# Patient Record
Sex: Female | Born: 1945 | Race: White | Hispanic: No | Smoking: Never smoker
Health system: Southern US, Community
[De-identification: ages and names within clinical notes are randomized; demographics above are authoritative.]

## PROBLEM LIST (undated history)

## (undated) DIAGNOSIS — I420 Dilated cardiomyopathy: Secondary | ICD-10-CM

## (undated) DIAGNOSIS — I219 Acute myocardial infarction, unspecified: Secondary | ICD-10-CM

## (undated) DIAGNOSIS — K219 Gastro-esophageal reflux disease without esophagitis: Secondary | ICD-10-CM

## (undated) DIAGNOSIS — E538 Deficiency of other specified B group vitamins: Secondary | ICD-10-CM

## (undated) DIAGNOSIS — E785 Hyperlipidemia, unspecified: Secondary | ICD-10-CM

## (undated) DIAGNOSIS — I2510 Atherosclerotic heart disease of native coronary artery without angina pectoris: Secondary | ICD-10-CM

## (undated) DIAGNOSIS — R0600 Dyspnea, unspecified: Secondary | ICD-10-CM

## (undated) DIAGNOSIS — E039 Hypothyroidism, unspecified: Secondary | ICD-10-CM

## (undated) DIAGNOSIS — M1990 Unspecified osteoarthritis, unspecified site: Secondary | ICD-10-CM

## (undated) HISTORY — PX: CARDIAC CATHETERIZATION: SHX172

## (undated) HISTORY — PX: ABDOMINAL HYSTERECTOMY: SHX81

---

## 2003-09-16 DIAGNOSIS — I219 Acute myocardial infarction, unspecified: Secondary | ICD-10-CM

## 2003-09-16 DIAGNOSIS — I214 Non-ST elevation (NSTEMI) myocardial infarction: Secondary | ICD-10-CM

## 2003-09-16 HISTORY — PX: CARDIAC CATHETERIZATION: SHX172

## 2003-09-16 HISTORY — DX: Non-ST elevation (NSTEMI) myocardial infarction: I21.4

## 2003-09-16 HISTORY — DX: Acute myocardial infarction, unspecified: I21.9

## 2004-07-10 ENCOUNTER — Ambulatory Visit: Payer: Self-pay | Admitting: Internal Medicine

## 2005-06-05 ENCOUNTER — Ambulatory Visit: Payer: Self-pay | Admitting: Internal Medicine

## 2005-07-17 ENCOUNTER — Ambulatory Visit: Payer: Self-pay | Admitting: Internal Medicine

## 2007-03-18 ENCOUNTER — Ambulatory Visit: Payer: Self-pay | Admitting: Internal Medicine

## 2008-09-06 ENCOUNTER — Ambulatory Visit: Payer: Self-pay | Admitting: Internal Medicine

## 2009-09-11 ENCOUNTER — Ambulatory Visit: Payer: Self-pay | Admitting: Internal Medicine

## 2010-09-27 ENCOUNTER — Ambulatory Visit: Payer: Self-pay | Admitting: Internal Medicine

## 2011-11-06 ENCOUNTER — Ambulatory Visit: Payer: Self-pay | Admitting: Internal Medicine

## 2012-10-18 ENCOUNTER — Emergency Department: Payer: Self-pay | Admitting: Unknown Physician Specialty

## 2012-10-18 LAB — CK TOTAL AND CKMB (NOT AT ARMC)
CK, Total: 62 U/L (ref 21–215)
CK-MB: 0.5 ng/mL (ref 0.5–3.6)

## 2012-10-18 LAB — CBC
HGB: 13.8 g/dL (ref 12.0–16.0)
MCH: 28.9 pg (ref 26.0–34.0)
MCHC: 31.5 g/dL — ABNORMAL LOW (ref 32.0–36.0)
MCV: 92 fL (ref 80–100)
Platelet: 267 10*3/uL (ref 150–440)
WBC: 8.9 10*3/uL (ref 3.6–11.0)

## 2012-10-18 LAB — BASIC METABOLIC PANEL
Anion Gap: 10 (ref 7–16)
Chloride: 106 mmol/L (ref 98–107)
Co2: 22 mmol/L (ref 21–32)
EGFR (African American): 60 — ABNORMAL LOW
Osmolality: 279 (ref 275–301)
Sodium: 138 mmol/L (ref 136–145)

## 2013-04-12 ENCOUNTER — Ambulatory Visit: Payer: Self-pay | Admitting: Internal Medicine

## 2013-04-21 ENCOUNTER — Ambulatory Visit: Payer: Self-pay | Admitting: Unknown Physician Specialty

## 2013-12-05 DIAGNOSIS — I2510 Atherosclerotic heart disease of native coronary artery without angina pectoris: Secondary | ICD-10-CM | POA: Insufficient documentation

## 2014-02-20 ENCOUNTER — Emergency Department (HOSPITAL_COMMUNITY)
Admission: EM | Admit: 2014-02-20 | Discharge: 2014-02-20 | Disposition: A | Discharge status: Home / Self Care | Payer: Medicare Other | Attending: Emergency Medicine | Admitting: Emergency Medicine

## 2014-02-20 ENCOUNTER — Emergency Department (HOSPITAL_COMMUNITY): Payer: Medicare Other

## 2014-02-20 ENCOUNTER — Encounter (HOSPITAL_COMMUNITY): Payer: Self-pay | Admitting: Emergency Medicine

## 2014-02-20 DIAGNOSIS — Z8679 Personal history of other diseases of the circulatory system: Secondary | ICD-10-CM | POA: Insufficient documentation

## 2014-02-20 DIAGNOSIS — I252 Old myocardial infarction: Secondary | ICD-10-CM | POA: Insufficient documentation

## 2014-02-20 DIAGNOSIS — IMO0002 Reserved for concepts with insufficient information to code with codable children: Secondary | ICD-10-CM | POA: Insufficient documentation

## 2014-02-20 DIAGNOSIS — M5417 Radiculopathy, lumbosacral region: Secondary | ICD-10-CM

## 2014-02-20 DIAGNOSIS — Z79899 Other long term (current) drug therapy: Secondary | ICD-10-CM | POA: Insufficient documentation

## 2014-02-20 HISTORY — DX: Acute myocardial infarction, unspecified: I21.9

## 2014-02-20 MED ORDER — PREDNISONE 20 MG PO TABS: ORAL_TABLET | ORAL | Status: DC

## 2014-02-20 MED ORDER — PREDNISONE 20 MG PO TABS
60.0000 mg | ORAL_TABLET | Freq: Once | ORAL | Status: AC
  Administered 2014-02-20: 60 mg via ORAL
  Filled 2014-02-20: qty 3

## 2014-02-20 MED ORDER — OXYCODONE-ACETAMINOPHEN 5-325 MG PO TABS: 1.0000 | ORAL_TABLET | ORAL | Status: DC | PRN

## 2014-02-20 NOTE — ED Notes (Addendum)
Pt c/o left hip pain x 1 month, went to PCP prescribed tramadol 50 mg. Pt states the pain has not subsided even with medication. Pt has not had picture of hip done.

## 2014-02-20 NOTE — ED Provider Notes (Signed)
CSN: [id number]     Arrival date & time 02/20/14  1245 History   First MD Initiated Contact with Patient 02/20/14 1307     Chief Complaint  Patient presents with  . Hip Pain     (Consider location/radiation/quality/duration/timing/severity/associated sxs/prior Treatment) HPI  Cindy Harper is a 68 y.o. female complaining of severe, atraumatic left lumbar sacral pain worsening over 4 weeks. Pain radiates down the posterior of the leg past the knee. Patient was seen by her primary care provided with tramadol and meloxicam with little relief. The pain is better after she gets up and walks for a while. Patient denies numbness, weakness, difficulty ambulating, prior history of trauma to the affected joint. Abdominal pain, nausea vomiting, change in bowel or bladder habits, fever, saddle anesthesia, history of cancer or IV drug use.  Past Medical History  Diagnosis Date  . Heart attack 2005   Past Surgical History  Procedure Laterality Date  . Abdominal hysterectomy     No family history on file. History  Substance Use Topics  . Smoking status: Never Smoker   . Smokeless tobacco: Not on file  . Alcohol Use: No   OB History   Grav Para Term Preterm Abortions TAB SAB Ect Mult Living                 Review of Systems  10 systems reviewed and found to be negative, except as noted in the HPI.    Allergies  Review of patient's allergies indicates no known allergies.  Home Medications   Prior to Admission medications   Medication Sig Start Date End Date Taking? Authorizing Provider  acetaminophen (TYLENOL) 500 MG tablet Take 500 mg by mouth every 6 (six) hours as needed for mild pain or headache.   Yes Historical Provider, MD  levothyroxine (SYNTHROID, LEVOTHROID) 100 MCG tablet Take 100 mcg by mouth daily before breakfast.   Yes Historical Provider, MD  meloxicam (MOBIC) 15 MG tablet Take 15 mg by mouth daily.   Yes Historical Provider, MD  oxyCODONE-acetaminophen  (PERCOCET/ROXICET) 5-325 MG per tablet Take 1 tablet by mouth every 4 (four) hours as needed for severe pain. May take 2 tablets PO q 6 hours for severe pain - Do not take with Tylenol as this tablet already contains tylenol 02/20/14   Joni Reining Kamylah Manzo, PA-C  predniSONE (DELTASONE) 20 MG tablet 3 tabs po daily x 3 days, then 2 tabs x 3 days, then 1.5 tabs x 3 days, then 1 tab x 3 days, then 0.5 tabs x 3 days 02/20/14   Joni Reining Korban Shearer, PA-C  traMADol (ULTRAM) 50 MG tablet Take 50 mg by mouth 3 (three) times daily.   Yes Historical Provider, MD   BP 131/75  Pulse 59  Temp(Src) 98.2 F (36.8 C) (Oral)  Resp 18  SpO2 99% Physical Exam  Nursing note and vitals reviewed. Constitutional: She is oriented to person, place, and time. She appears well-developed and well-nourished. No distress.  HENT:  Head: Normocephalic and atraumatic.  Mouth/Throat: Oropharynx is clear and moist.  Eyes: Conjunctivae and EOM are normal. Pupils are equal, round, and reactive to light.  Neck: Normal range of motion.  Cardiovascular: Normal rate, regular rhythm and intact distal pulses.   Pulmonary/Chest: Effort normal and breath sounds normal. No stridor. No respiratory distress. She has no wheezes. She has no rales. She exhibits no tenderness.  Abdominal: Soft. Bowel sounds are normal.  Musculoskeletal: Normal range of motion.       Back:  No point tenderness to percussion of lumbar spinal processes.  No TTP or paraspinal muscular spasm. Strength is 5 out of 5 to bilateral lower extremities at hip and knee; extensor hallucis longus 5 out of 5. Ankle strength 5 out of 5, no clonus, neurovascularly intact. No saddle anaesthesia. Patellar reflexes are 2+ bilaterally.    Straight leg raise is positive on the ipsilateral (left) side at 20, negative on the contralateral side.   Neurological: She is alert and oriented to person, place, and time.  Psychiatric: She has a normal mood and affect.    ED Course  Procedures  (including critical care time) Labs Review Labs Reviewed - No data to display  Imaging Review Dg Hip Complete Left  02/20/2014   CLINICAL DATA:  Increasing left hip pain.  EXAM: LEFT HIP - COMPLETE 2+ VIEW  COMPARISON:  None.  FINDINGS: Hip joint space is maintained bilaterally. No subchondral sclerosis or cyst formation. No osteophytosis. Obturator rings are intact. Degenerative changes are seen at the lumbosacral junction.  IMPRESSION: 1. No findings in the hips to explain the patient's pain. 2. Degenerative changes at the lumbosacral junction.   Electronically Signed   By: Leanna BattlesMelinda  Blietz M.D.   On: 02/20/2014 13:37     EKG Interpretation None      MDM   Final diagnoses:  Lumbosacral radiculopathy   Filed Vitals:   02/20/14 1301 02/20/14 1417  BP: 134/70 131/75  Pulse: 60 59  Temp: 98.2 F (36.8 C)   TempSrc: Oral   Resp: 16 18  SpO2: 98% 99%    Medications  predniSONE (DELTASONE) tablet 60 mg (60 mg Oral Given 02/20/14 1416)    Cindy Harper is a 68 y.o. female presenting with left lumbosacral radiculopathy. 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 Physical exam consistent with a sciatic radiculopathy.  back pain.  No neurological deficits and normal neuro exam.  Patient can walk but states is painful.  No loss of bowel or bladder control.  No concern for cauda equina.  Advised close followup with primary care and orthopedics.  Patient lives alone and I'm concerned about falls. We have discussed taking the oxycodone only at night before bedtime. In the day she will continue to take the tramadol. Will also start her on prednisone.  Evaluation does not show pathology that would require ongoing emergent intervention or inpatient treatment. Pt is hemodynamically stable and mentating appropriately. Discussed findings and plan with patient/guardian, who agrees with care plan. All questions answered. Return precautions discussed and outpatient follow up given.    Discharge Medication List as of 02/20/2014  1:52 PM    START taking these medications   Details  oxyCODONE-acetaminophen (PERCOCET/ROXICET) 5-325 MG per tablet Take 1 tablet by mouth every 4 (four) hours as needed for severe pain. May take 2 tablets PO q 6 hours for severe pain - Do not take with Tylenol as this tablet already contains tylenol, Starting 02/20/2014, Until Discontinued, Print    predniSONE (DELTASONE) 20 MG tablet 3 tabs po daily x 3 days, then 2 tabs x 3 days, then 1.5 tabs x 3 days, then 1 tab x 3 days, then 0.5 tabs x 3 days, Print           Wynetta Emeryicole Jayten Gabbard, PA-C 02/20/14 1640

## 2014-02-20 NOTE — Discharge Instructions (Signed)
Take acetaminophen (Tylenol) up to 975 mg (this is normally 3 over-the-counter pills) up to 3 times a day. Do not drink alcohol. Make sure your other medications do not contain acetaminophen (Read the labels!)  Take oxycodone for breakthrough pain before bedtime, do not drink alcohol, drive, care for children or do other critical tasks while taking oxycodone.  Do not combine oxycodone and tramadol.  Please be very careful not to fall!  The pain medication puts you at risk for falls. Please rest as much as possible and try to not stay alone.   Please follow with your primary care doctor in the next 2 days for a check-up. They must obtain records for further management.   Do not hesitate to return to the Emergency Department for any new, worsening or concerning symptoms.       Lumbosacral Radiculopathy Lumbosacral radiculopathy is a pinched nerve or nerves in the low back (lumbosacral area). When this happens you may have weakness in your legs and may not be able to stand on your toes. You may have pain going down into your legs. There may be difficulties with walking normally. There are many causes of this problem. Sometimes this may happen from an injury, or simply from arthritis or boney problems. It may also be caused by other illnesses such as diabetes. If there is no improvement after treatment, further studies may be done to find the exact cause. DIAGNOSIS  X-rays may be needed if the problems become long standing. Electromyograms may be done. This study is one in which the working of nerves and muscles is studied. HOME CARE INSTRUCTIONS   Applications of ice packs may be helpful. Ice can be used in a plastic bag with a towel around it to prevent frostbite to skin. This may be used every 2 hours for 20 to 30 minutes, or as needed, while awake, or as directed by your caregiver.  Only take over-the-counter or prescription medicines for pain, discomfort, or fever as directed by your  caregiver.  If physical therapy was prescribed, follow your caregiver's directions. SEEK IMMEDIATE MEDICAL CARE IF:   You have pain not controlled with medications.  You seem to be getting worse rather than better.  You develop increasing weakness in your legs.  You develop loss of bowel or bladder control.  You have difficulty with walking or balance, or develop clumsiness in the use of your legs.  You have a fever. MAKE SURE YOU:   Understand these instructions.  Will watch your condition.  Will get help right away if you are not doing well or get worse. Document Released: 09/01/2005 Document Revised: 11/24/2011 Document Reviewed: 04/21/2008 [facility] Patient Information 2014 [place], [place].

## 2014-02-20 NOTE — ED Notes (Signed)
Pt in xray

## 2014-02-21 MED ORDER — OXYCODONE HCL 5 MG PO TABS: 5.0000 mg | ORAL_TABLET | ORAL | Status: DC | PRN

## 2014-02-21 NOTE — ED Provider Notes (Signed)
Medical screening examination/treatment/procedure(s) were performed by non-physician practitioner and as supervising physician I was immediately available for consultation/collaboration.  Flint Melter, MD 02/21/14 [phone]

## 2014-02-21 NOTE — ED Provider Notes (Signed)
6:46 PM Called the patient to advise her not to take Tylenol with the Percocet. Patient verbalized her understanding. Reports that she is feeling better. States she will follow with orthopedics.   Wynetta Emery, PA-C 02/21/14 1846  Flint Melter, MD 02/24/14 2322

## 2014-02-24 ENCOUNTER — Ambulatory Visit: Payer: Self-pay | Admitting: Internal Medicine

## 2014-05-16 ENCOUNTER — Ambulatory Visit: Payer: Self-pay | Admitting: Internal Medicine

## 2014-06-12 ENCOUNTER — Ambulatory Visit: Payer: Self-pay | Admitting: General Practice

## 2014-07-11 DIAGNOSIS — E782 Mixed hyperlipidemia: Secondary | ICD-10-CM | POA: Insufficient documentation

## 2015-01-05 NOTE — Consult Note (Signed)
PATIENT NAME:  Cindy Harper, Cindy P MR#:  [id number] DATE OF BIRTH:  May 21, 1946  DATE OF CONSULTATION:  10/18/2012  REFERRING PHYSICIAN:     Dr. Enedina FinnerGoli. CONSULTING PHYSICIAN:  Lamar BlinksBruce J. Krayton Wortley, MD  REASON FOR CONSULTATION: Chest discomfort, known coronary artery disease.   CHIEF COMPLAINT: "I had chest pain."   HISTORY OF PRESENT ILLNESS:  This is a 69 year old female with known coronary artery disease with minimal cardiac atherosclerosis by cardiac catheterization many years prior. The patient has had no evidence of significant hyperlipidemia requiring medication management. Hypertension has been controlled and she does not have diabetes. She had new onset of chest pain, substernal in nature, with some shortness of breath and weakness over a 30 to 40 minute period.  She was seen in the Emergency Room with full resolution spontaneously. The patient had an EKG showing normal sinus rhythm with nonspecific ST changes and troponin and CK-MB are within normal limits. She is fine now with ambulation and no other significant symptoms.   REVIEW OF SYSTEMS: Remainder of review of systems negative for vision change, ringing in the ears, hearing loss, cough, congestion, heartburn, nausea, vomiting, diarrhea, bloody stool, stomach pain, extremity pain, leg weakness, cramping of the buttocks, known blood clots, headaches, blackouts, dizzy spells, nosebleed, congestion, trouble swallowing, frequent urination, urination at night, muscle which is numbness, anxiety, depression, skin lesions or skin rashes.   PAST MEDICAL HISTORY:  Coronary artery disease.   FAMILY HISTORY: No family members with early onset of cardiovascular disease or hypertension.   SOCIAL HISTORY: The patient currently denies alcohol or tobacco use.   ALLERGIES: No known drug allergies.   CURRENT MEDICATIONS: As listed.   PHYSICAL EXAMINATION: VITAL SIGNS: Blood pressure 126/68 bilaterally, heart rate 72 upright, reclining and regular.   GENERAL: She is a well appearing female in no acute distress.  HEENT: No icterus, thyromegaly, ulcers, hemorrhage or xanthelasma.  HEART: Regular rate and rhythm with normal S1 and S2 without murmur, gallop or rub. PMI is normal size and placement. Carotid upstroke normal without bruit. Jugular venous pressure is normal.  LUNGS: Lungs are clear to auscultation with normal respirations.  ABDOMEN: Soft, nontender, without hepatosplenomegaly or masses. Abdominal aorta is normal size without bruit.  EXTREMITIES: 2+ bilateral pulses in dorsal, pedal, radial and femoral arteries without lower extremity edema, cyanosis, clubbing or ulcers.  NEUROLOGIC: She is oriented to time, place and person with normal mood and affect.   ASSESSMENT: A 69 year old female with chest pain of unknown etiology, shortness of breath now resolved with no evidence of myocardial infarction or acute myocardial infarction, stable at this time.   RECOMMENDATIONS: 1.  Aspirin 325 mg each day.  2.  Ambulate patient and follow for improvements of symptoms.  3.  If the patient doing fairly well with no other significant symptoms, may discharge to home with follow-up stress test thereafter for further evaluation of hyperlipidemia with high intensity cholesterol therapy due to coronary artery disease.  4.  Recheck as above.     ____________________________ Lamar BlinksBruce J. Kadeen Sroka, MD bjk:ct D: 10/18/2012 17:39:58 ET T: 10/19/2012 08:57:07 ET JOB#: [id number]  cc: Lamar BlinksBruce J. Lamonda Noxon, MD, <Dictator> Lamar BlinksBRUCE J Garrie Elenes MD ELECTRONICALLY SIGNED 11/12/2012 8:52

## 2015-01-16 ENCOUNTER — Other Ambulatory Visit: Payer: Self-pay | Admitting: Internal Medicine

## 2015-01-16 DIAGNOSIS — Z1231 Encounter for screening mammogram for malignant neoplasm of breast: Secondary | ICD-10-CM

## 2015-05-22 ENCOUNTER — Other Ambulatory Visit: Payer: Self-pay | Admitting: Internal Medicine

## 2015-05-22 ENCOUNTER — Ambulatory Visit
Admission: RE | Admit: 2015-05-22 | Discharge: 2015-05-22 | Disposition: A | Discharge status: Home / Self Care | Payer: Medicare Other | Source: Ambulatory Visit | Attending: Internal Medicine | Admitting: Internal Medicine

## 2015-05-22 DIAGNOSIS — Z1231 Encounter for screening mammogram for malignant neoplasm of breast: Secondary | ICD-10-CM

## 2016-04-09 ENCOUNTER — Other Ambulatory Visit: Payer: Self-pay | Admitting: Internal Medicine

## 2016-04-09 DIAGNOSIS — Z1231 Encounter for screening mammogram for malignant neoplasm of breast: Secondary | ICD-10-CM

## 2016-05-27 ENCOUNTER — Ambulatory Visit: Payer: Medicare Other

## 2016-06-05 ENCOUNTER — Ambulatory Visit: Payer: Medicare Other

## 2016-06-19 ENCOUNTER — Ambulatory Visit
Admission: RE | Admit: 2016-06-19 | Discharge: 2016-06-19 | Disposition: A | Discharge status: Home / Self Care | Payer: Medicare Other | Source: Ambulatory Visit | Attending: Internal Medicine | Admitting: Internal Medicine

## 2016-06-19 ENCOUNTER — Other Ambulatory Visit: Payer: Self-pay | Admitting: Internal Medicine

## 2016-06-19 DIAGNOSIS — Z1231 Encounter for screening mammogram for malignant neoplasm of breast: Secondary | ICD-10-CM | POA: Diagnosis present

## 2016-06-25 ENCOUNTER — Encounter
Admission: RE | Admit: 2016-06-25 | Discharge: 2016-06-25 | Disposition: A | Discharge status: Home / Self Care | Payer: Medicare Other | Source: Ambulatory Visit | Attending: Orthopedic Surgery | Admitting: Orthopedic Surgery

## 2016-06-25 DIAGNOSIS — Z01810 Encounter for preprocedural cardiovascular examination: Secondary | ICD-10-CM | POA: Diagnosis present

## 2016-06-25 DIAGNOSIS — E039 Hypothyroidism, unspecified: Secondary | ICD-10-CM | POA: Diagnosis not present

## 2016-06-25 DIAGNOSIS — I252 Old myocardial infarction: Secondary | ICD-10-CM | POA: Insufficient documentation

## 2016-06-25 DIAGNOSIS — K219 Gastro-esophageal reflux disease without esophagitis: Secondary | ICD-10-CM | POA: Diagnosis not present

## 2016-06-25 DIAGNOSIS — Z01812 Encounter for preprocedural laboratory examination: Secondary | ICD-10-CM | POA: Diagnosis present

## 2016-06-25 HISTORY — DX: Hypothyroidism, unspecified: E03.9

## 2016-06-25 HISTORY — DX: Gastro-esophageal reflux disease without esophagitis: K21.9

## 2016-06-25 HISTORY — DX: Dyspnea, unspecified: R06.00

## 2016-06-25 LAB — URINALYSIS COMPLETE WITH MICROSCOPIC (ARMC ONLY)
Bilirubin Urine: NEGATIVE
Glucose, UA: NEGATIVE mg/dL
KETONES UR: NEGATIVE mg/dL
NITRITE: POSITIVE — AB
PH: 6 (ref 5.0–8.0)
PROTEIN: NEGATIVE mg/dL
SPECIFIC GRAVITY, URINE: 1.017 (ref 1.005–1.030)

## 2016-06-25 LAB — COMPREHENSIVE METABOLIC PANEL
ALBUMIN: 4.1 g/dL (ref 3.5–5.0)
ALK PHOS: 93 U/L (ref 38–126)
ALT: 15 U/L (ref 14–54)
ANION GAP: 6 (ref 5–15)
AST: 15 U/L (ref 15–41)
BUN: 15 mg/dL (ref 6–20)
CALCIUM: 9.1 mg/dL (ref 8.9–10.3)
CO2: 25 mmol/L (ref 22–32)
Chloride: 109 mmol/L (ref 101–111)
Creatinine, Ser: 0.93 mg/dL (ref 0.44–1.00)
GFR calc Af Amer: >60 mL/min (ref >60)
GFR calc non Af Amer: 60 mL/min — ABNORMAL LOW (ref >60)
GLUCOSE: 83 mg/dL (ref 65–99)
Potassium: 4 mmol/L (ref 3.5–5.1)
Sodium: 140 mmol/L (ref 135–145)
TOTAL PROTEIN: 7 g/dL (ref 6.5–8.1)
Total Bilirubin: 0.7 mg/dL (ref 0.3–1.2)

## 2016-06-25 LAB — CBC
HEMATOCRIT: 40 % (ref 35.0–47.0)
HEMOGLOBIN: 13.6 g/dL (ref 12.0–16.0)
MCH: 31.1 pg (ref 26.0–34.0)
MCHC: 33.9 g/dL (ref 32.0–36.0)
MCV: 91.5 fL (ref 80.0–100.0)
Platelets: 246 10*3/uL (ref 150–440)
RBC: 4.37 MIL/uL (ref 3.80–5.20)
RDW: 14.5 % (ref 11.5–14.5)
WBC: 6.4 10*3/uL (ref 3.6–11.0)

## 2016-06-25 LAB — SURGICAL PCR SCREEN
MRSA, PCR: NEGATIVE
STAPHYLOCOCCUS AUREUS: NEGATIVE

## 2016-06-25 LAB — SEDIMENTATION RATE: SED RATE: 9 mm/h (ref 0–30)

## 2016-06-25 LAB — PROTIME-INR
INR: 0.94
Prothrombin Time: 12.6 seconds (ref 11.4–15.2)

## 2016-06-25 LAB — TYPE AND SCREEN
ABO/RH(D): A NEG
Antibody Screen: NEGATIVE

## 2016-06-25 LAB — APTT: APTT: 30 s (ref 24–36)

## 2016-06-25 NOTE — Pre-Procedure Instructions (Signed)
REQUEST FOR MEDICAL CLEARANCE/EKG,AS INSTRUCTED BY DR ADAMS CALLED AND [facility]ED TO [facility]

## 2016-06-25 NOTE — Patient Instructions (Signed)
  Your procedure is scheduled on: 07/09/16 Wed Report to Same Day Surgery 2nd floor medical mall To find out your arrival time please call [phone] between 1PM - 3PM on 07/08/16 Tues  Remember: Instructions that are not followed completely may result in serious medical risk, up to and including death, or upon the discretion of your surgeon and anesthesiologist your surgery may need to be rescheduled.    _x___ 1. Do not eat food or drink liquids after midnight. No gum chewing or hard candies.     __x__ 2. No Alcohol for 24 hours before or after surgery.   __x__3. No Smoking for 24 prior to surgery.   ____  4. Bring all medications with you on the day of surgery if instructed.    __x__ 5. Notify your doctor if there is any change in your medical condition     (cold, fever, infections).     Do not wear jewelry, make-up, hairpins, clips or nail polish.  Do not wear lotions, powders, or perfumes. You may wear deodorant.  Do not shave 48 hours prior to surgery. Men may shave face and neck.  Do not bring valuables to the hospital.    [facility] not [facility] any belongings or valuables.               Contacts, dentures or bridgework may not be worn into surgery.  Leave your suitcase in the car. After surgery it may be brought to your room.  For patients admitted to the hospital, discharge time is determined by your treatment team.   Patients discharged the day of surgery will not be allowed to drive home.    Please read over the following fact sheets that you were given:   [facility] Pre[facility] and or MRSA Information   _x___ Take these medicines the morning of surgery with A SIP OF WATER:    1. levothyroxine (SYNTHROID, LEVOTHROID) 100 MCG tablet  2.omeprazole (PRILOSEC OTC) 20 MG tablet  3.  4.  5.  6.  ____Fleets enema or Magnesium Citrate as directed.   _x___ Use CHG Soap or sage wipes as directed on instruction sheet   ____ Use inhalers on the  day of surgery and bring to hospital day of surgery  ____ Stop metformin 2 days prior to surgery    ____ Take 1/2 of usual insulin dose the night before surgery and none on the morning of           surgery.   __x__ Stop aspirin or coumadin, or plavix Stop Aspirin 1 week before surgery  x__ Stop Anti-inflammatories such as Advil, Aleve, Ibuprofen, Motrin, Naproxen,          Naprosyn, Goodies powders or aspirin products. Ok to take Tylenol. Stop Lodine 1 week before surgery   ____ Stop supplements until after surgery.    ____ Bring C-Pap to the hospital.

## 2016-06-27 LAB — URINE CULTURE
Culture: >=100000 — AB
SPECIAL REQUESTS: NORMAL

## 2016-07-02 NOTE — Pre-Procedure Instructions (Signed)
CLEARED BY DR Judie PetitM  MILLER LOW RISK 07/01/16

## 2016-07-09 ENCOUNTER — Inpatient Hospital Stay: Payer: Medicare Other | Admitting: Certified Registered Nurse Anesthetist

## 2016-07-09 ENCOUNTER — Encounter
Admission: RE | Disposition: A | Discharge status: Home Health | Payer: Self-pay | Source: Ambulatory Visit | Attending: Orthopedic Surgery

## 2016-07-09 ENCOUNTER — Encounter: Payer: Self-pay | Admitting: *Deleted

## 2016-07-09 ENCOUNTER — Inpatient Hospital Stay
Admission: RE | Admit: 2016-07-09 | Discharge: 2016-07-11 | DRG: 470 | Disposition: A | Discharge status: Home Health | Payer: Medicare Other | Source: Ambulatory Visit | Attending: Orthopedic Surgery | Admitting: Orthopedic Surgery

## 2016-07-09 ENCOUNTER — Inpatient Hospital Stay: Payer: Medicare Other

## 2016-07-09 DIAGNOSIS — Z96659 Presence of unspecified artificial knee joint: Secondary | ICD-10-CM

## 2016-07-09 DIAGNOSIS — I2510 Atherosclerotic heart disease of native coronary artery without angina pectoris: Secondary | ICD-10-CM | POA: Diagnosis present

## 2016-07-09 DIAGNOSIS — Z79899 Other long term (current) drug therapy: Secondary | ICD-10-CM

## 2016-07-09 DIAGNOSIS — Z888 Allergy status to other drugs, medicaments and biological substances status: Secondary | ICD-10-CM | POA: Diagnosis not present

## 2016-07-09 DIAGNOSIS — E039 Hypothyroidism, unspecified: Secondary | ICD-10-CM | POA: Diagnosis present

## 2016-07-09 DIAGNOSIS — M6281 Muscle weakness (generalized): Secondary | ICD-10-CM

## 2016-07-09 DIAGNOSIS — K219 Gastro-esophageal reflux disease without esophagitis: Secondary | ICD-10-CM | POA: Diagnosis present

## 2016-07-09 DIAGNOSIS — R262 Difficulty in walking, not elsewhere classified: Secondary | ICD-10-CM

## 2016-07-09 DIAGNOSIS — E785 Hyperlipidemia, unspecified: Secondary | ICD-10-CM | POA: Diagnosis present

## 2016-07-09 DIAGNOSIS — I252 Old myocardial infarction: Secondary | ICD-10-CM | POA: Diagnosis not present

## 2016-07-09 DIAGNOSIS — M25562 Pain in left knee: Secondary | ICD-10-CM

## 2016-07-09 DIAGNOSIS — Z96652 Presence of left artificial knee joint: Secondary | ICD-10-CM

## 2016-07-09 DIAGNOSIS — Z6832 Body mass index (BMI) 32.0-32.9, adult: Secondary | ICD-10-CM | POA: Diagnosis not present

## 2016-07-09 DIAGNOSIS — M1712 Unilateral primary osteoarthritis, left knee: Secondary | ICD-10-CM | POA: Diagnosis present

## 2016-07-09 HISTORY — PX: KNEE ARTHROPLASTY: SHX992

## 2016-07-09 LAB — ABO/RH: ABO/RH(D): A NEG

## 2016-07-09 SURGERY — ARTHROPLASTY, KNEE, TOTAL, USING IMAGELESS COMPUTER-ASSISTED NAVIGATION
Anesthesia: Spinal | Site: Knee | Laterality: Left | Wound class: Clean

## 2016-07-09 MED ORDER — PANTOPRAZOLE SODIUM 40 MG PO TBEC
40.0000 mg | DELAYED_RELEASE_TABLET | Freq: Two times a day (BID) | ORAL | Status: DC
  Administered 2016-07-09 – 2016-07-11 (×5): 40 mg via ORAL
  Filled 2016-07-09 (×5): qty 1

## 2016-07-09 MED ORDER — VITAMIN B-12 100 MCG PO TABS
100.0000 ug | ORAL_TABLET | Freq: Every day | ORAL | Status: DC
  Administered 2016-07-09 – 2016-07-11 (×2): 100 ug via ORAL
  Filled 2016-07-09 (×3): qty 1

## 2016-07-09 MED ORDER — TETRACAINE HCL 1 % IJ SOLN: INTRAMUSCULAR | Status: DC | PRN

## 2016-07-09 MED ORDER — MENTHOL 3 MG MT LOZG
1.0000 | LOZENGE | OROMUCOSAL | Status: DC | PRN
  Filled 2016-07-09: qty 9

## 2016-07-09 MED ORDER — SODIUM CHLORIDE 0.9 % IV SOLN
INTRAVENOUS | Status: DC
  Administered 2016-07-09: 21:00:00 via INTRAVENOUS
  Administered 2016-07-09: 1000 mL via INTRAVENOUS

## 2016-07-09 MED ORDER — LIDOCAINE HCL (CARDIAC) 20 MG/ML IV SOLN
INTRAVENOUS | Status: DC | PRN
  Administered 2016-07-09 (×2): 30 mg via INTRAVENOUS

## 2016-07-09 MED ORDER — ONDANSETRON HCL 4 MG/2ML IJ SOLN: 4.0000 mg | Freq: Once | INTRAMUSCULAR | Status: DC | PRN

## 2016-07-09 MED ORDER — BISACODYL 10 MG RE SUPP: 10.0000 mg | Freq: Every day | RECTAL | Status: DC | PRN

## 2016-07-09 MED ORDER — SODIUM CHLORIDE 0.9 % IJ SOLN
INTRAMUSCULAR | Status: AC
  Filled 2016-07-09: qty 50

## 2016-07-09 MED ORDER — ACETAMINOPHEN 650 MG RE SUPP: 650.0000 mg | Freq: Four times a day (QID) | RECTAL | Status: DC | PRN

## 2016-07-09 MED ORDER — BUPIVACAINE HCL (PF) 0.25 % IJ SOLN
INTRAMUSCULAR | Status: DC | PRN
  Administered 2016-07-09: 60 mL

## 2016-07-09 MED ORDER — CEFAZOLIN SODIUM-DEXTROSE 2-4 GM/100ML-% IV SOLN
2.0000 g | Freq: Four times a day (QID) | INTRAVENOUS | Status: AC
  Administered 2016-07-09 – 2016-07-10 (×4): 2 g via INTRAVENOUS
  Filled 2016-07-09 (×5): qty 100

## 2016-07-09 MED ORDER — NEOMYCIN-POLYMYXIN B GU 40-200000 IR SOLN
Status: DC | PRN
  Administered 2016-07-09: 14 mL

## 2016-07-09 MED ORDER — CHLORHEXIDINE GLUCONATE 4 % EX LIQD
60.0000 mL | Freq: Once | CUTANEOUS | Status: AC
  Administered 2016-07-09: 4 via TOPICAL

## 2016-07-09 MED ORDER — LACTATED RINGERS IV SOLN
INTRAVENOUS | Status: DC
  Administered 2016-07-09: 07:00:00 via INTRAVENOUS

## 2016-07-09 MED ORDER — ONDANSETRON HCL 4 MG/2ML IJ SOLN: 4.0000 mg | Freq: Four times a day (QID) | INTRAMUSCULAR | Status: DC | PRN

## 2016-07-09 MED ORDER — PHENOL 1.4 % MT LIQD
1.0000 | OROMUCOSAL | Status: DC | PRN
  Filled 2016-07-09: qty 177

## 2016-07-09 MED ORDER — PROPOFOL 10 MG/ML IV BOLUS
INTRAVENOUS | Status: DC | PRN
  Administered 2016-07-09 (×2): 10 mg via INTRAVENOUS

## 2016-07-09 MED ORDER — ACETAMINOPHEN 10 MG/ML IV SOLN
1000.0000 mg | Freq: Four times a day (QID) | INTRAVENOUS | Status: AC
  Administered 2016-07-09 (×3): 1000 mg via INTRAVENOUS
  Filled 2016-07-09 (×4): qty 100

## 2016-07-09 MED ORDER — CEFAZOLIN SODIUM-DEXTROSE 2-4 GM/100ML-% IV SOLN
2.0000 g | INTRAVENOUS | Status: AC
  Administered 2016-07-09: 2 g via INTRAVENOUS

## 2016-07-09 MED ORDER — BUPIVACAINE HCL (PF) 0.5 % IJ SOLN
INTRAMUSCULAR | Status: DC | PRN
  Administered 2016-07-09: 2.5 mL

## 2016-07-09 MED ORDER — TRANEXAMIC ACID 1000 MG/10ML IV SOLN
1000.0000 mg | INTRAVENOUS | Status: AC
  Administered 2016-07-09: 1000 mg via INTRAVENOUS
  Filled 2016-07-09: qty 10

## 2016-07-09 MED ORDER — SODIUM CHLORIDE 0.9 % IV SOLN
INTRAVENOUS | Status: DC | PRN
  Administered 2016-07-09: 60 mL

## 2016-07-09 MED ORDER — NEOMYCIN-POLYMYXIN B GU 40-200000 IR SOLN
Status: AC
  Filled 2016-07-09: qty 20

## 2016-07-09 MED ORDER — MORPHINE SULFATE (PF) 2 MG/ML IV SOLN: 2.0000 mg | INTRAVENOUS | Status: DC | PRN

## 2016-07-09 MED ORDER — ONDANSETRON HCL 4 MG PO TABS: 4.0000 mg | ORAL_TABLET | Freq: Four times a day (QID) | ORAL | Status: DC | PRN

## 2016-07-09 MED ORDER — PROPOFOL 500 MG/50ML IV EMUL
INTRAVENOUS | Status: DC | PRN
  Administered 2016-07-09: 65 ug/kg/min via INTRAVENOUS

## 2016-07-09 MED ORDER — TRAMADOL HCL 50 MG PO TABS
50.0000 mg | ORAL_TABLET | ORAL | Status: DC | PRN
  Administered 2016-07-09: 50 mg via ORAL
  Administered 2016-07-09 – 2016-07-10 (×4): 100 mg via ORAL
  Administered 2016-07-10 – 2016-07-11 (×2): 50 mg via ORAL
  Filled 2016-07-09: qty 2
  Filled 2016-07-09: qty 1
  Filled 2016-07-09: qty 2
  Filled 2016-07-09: qty 1
  Filled 2016-07-09 (×2): qty 2
  Filled 2016-07-09: qty 1
  Filled 2016-07-09: qty 2

## 2016-07-09 MED ORDER — BUPIVACAINE LIPOSOME 1.3 % IJ SUSP
INTRAMUSCULAR | Status: AC
  Filled 2016-07-09: qty 20

## 2016-07-09 MED ORDER — ACETAMINOPHEN 325 MG PO TABS
650.0000 mg | ORAL_TABLET | Freq: Four times a day (QID) | ORAL | Status: DC | PRN
  Administered 2016-07-11: 650 mg via ORAL
  Filled 2016-07-09: qty 2

## 2016-07-09 MED ORDER — TETRACAINE HCL 1 % IJ SOLN
INTRAMUSCULAR | Status: DC | PRN
  Administered 2016-07-09: 2.5 mg via INTRASPINAL

## 2016-07-09 MED ORDER — BUPIVACAINE HCL (PF) 0.25 % IJ SOLN
INTRAMUSCULAR | Status: AC
  Filled 2016-07-09: qty 60

## 2016-07-09 MED ORDER — DIPHENHYDRAMINE HCL 12.5 MG/5ML PO ELIX: 12.5000 mg | ORAL_SOLUTION | ORAL | Status: DC | PRN

## 2016-07-09 MED ORDER — MIDAZOLAM HCL 5 MG/5ML IJ SOLN
INTRAMUSCULAR | Status: DC | PRN
  Administered 2016-07-09: 2 mg via INTRAVENOUS

## 2016-07-09 MED ORDER — PHENYLEPHRINE HCL 10 MG/ML IJ SOLN
INTRAMUSCULAR | Status: DC | PRN
  Administered 2016-07-09: 100 ug via INTRAVENOUS

## 2016-07-09 MED ORDER — TETRACAINE HCL 1 % IJ SOLN
INTRAMUSCULAR | Status: AC
  Filled 2016-07-09: qty 2

## 2016-07-09 MED ORDER — SODIUM CHLORIDE 0.9 % IV SOLN
INTRAVENOUS | Status: DC | PRN
  Administered 2016-07-09: 10 ug/min via INTRAVENOUS

## 2016-07-09 MED ORDER — ACETAMINOPHEN 10 MG/ML IV SOLN
INTRAVENOUS | Status: AC
  Filled 2016-07-09: qty 100

## 2016-07-09 MED ORDER — OXYCODONE HCL 5 MG PO TABS: 5.0000 mg | ORAL_TABLET | ORAL | Status: DC | PRN

## 2016-07-09 MED ORDER — ALUM & MAG HYDROXIDE-SIMETH 200-200-20 MG/5ML PO SUSP: 30.0000 mL | ORAL | Status: DC | PRN

## 2016-07-09 MED ORDER — ACETAMINOPHEN 10 MG/ML IV SOLN
INTRAVENOUS | Status: DC | PRN
  Administered 2016-07-09: 1000 mg via INTRAVENOUS

## 2016-07-09 MED ORDER — SODIUM CHLORIDE 0.9 % IV BOLUS (SEPSIS)
500.0000 mL | Freq: Once | INTRAVENOUS | Status: AC
  Administered 2016-07-09: 500 mL via INTRAVENOUS

## 2016-07-09 MED ORDER — FERROUS SULFATE 325 (65 FE) MG PO TABS
325.0000 mg | ORAL_TABLET | Freq: Two times a day (BID) | ORAL | Status: DC
  Administered 2016-07-09 – 2016-07-11 (×4): 325 mg via ORAL
  Filled 2016-07-09 (×4): qty 1

## 2016-07-09 MED ORDER — LEVOTHYROXINE SODIUM 100 MCG PO TABS
100.0000 ug | ORAL_TABLET | Freq: Every day | ORAL | Status: DC
  Administered 2016-07-10 – 2016-07-11 (×3): 100 ug via ORAL
  Filled 2016-07-09 (×3): qty 1

## 2016-07-09 MED ORDER — FENTANYL CITRATE (PF) 100 MCG/2ML IJ SOLN: 25.0000 ug | INTRAMUSCULAR | Status: DC | PRN

## 2016-07-09 MED ORDER — CELECOXIB 200 MG PO CAPS
200.0000 mg | ORAL_CAPSULE | Freq: Two times a day (BID) | ORAL | Status: DC
  Administered 2016-07-09 – 2016-07-11 (×5): 200 mg via ORAL
  Filled 2016-07-09 (×5): qty 1

## 2016-07-09 MED ORDER — ENOXAPARIN SODIUM 30 MG/0.3ML ~~LOC~~ SOLN
30.0000 mg | Freq: Two times a day (BID) | SUBCUTANEOUS | Status: DC
  Administered 2016-07-10 – 2016-07-11 (×3): 30 mg via SUBCUTANEOUS
  Filled 2016-07-09 (×3): qty 0.3

## 2016-07-09 MED ORDER — SODIUM CHLORIDE 0.9 % IV SOLN
1000.0000 mg | Freq: Once | INTRAVENOUS | Status: AC
  Administered 2016-07-09: 1000 mg via INTRAVENOUS
  Filled 2016-07-09: qty 10

## 2016-07-09 MED ORDER — SENNOSIDES-DOCUSATE SODIUM 8.6-50 MG PO TABS
1.0000 | ORAL_TABLET | Freq: Two times a day (BID) | ORAL | Status: DC
  Administered 2016-07-09 – 2016-07-11 (×5): 1 via ORAL
  Filled 2016-07-09 (×5): qty 1

## 2016-07-09 MED ORDER — BUPIVACAINE HCL (PF) 0.5 % IJ SOLN: INTRAMUSCULAR | Status: DC | PRN

## 2016-07-09 MED ORDER — DEXAMETHASONE SODIUM PHOSPHATE 10 MG/ML IJ SOLN
INTRAMUSCULAR | Status: DC | PRN
  Administered 2016-07-09: 10 mg via INTRAVENOUS

## 2016-07-09 MED ORDER — METOCLOPRAMIDE HCL 10 MG PO TABS
10.0000 mg | ORAL_TABLET | Freq: Three times a day (TID) | ORAL | Status: AC
  Administered 2016-07-09 – 2016-07-11 (×8): 10 mg via ORAL
  Filled 2016-07-09 (×9): qty 1

## 2016-07-09 MED ORDER — FENTANYL CITRATE (PF) 100 MCG/2ML IJ SOLN
INTRAMUSCULAR | Status: DC | PRN
  Administered 2016-07-09 (×2): 50 ug via INTRAVENOUS

## 2016-07-09 MED ORDER — CEFAZOLIN SODIUM-DEXTROSE 2-4 GM/100ML-% IV SOLN
INTRAVENOUS | Status: AC
Start: 2016-07-09 — End: 2016-07-09
  Filled 2016-07-09: qty 100

## 2016-07-09 MED ORDER — ONDANSETRON HCL 4 MG/2ML IJ SOLN
INTRAMUSCULAR | Status: DC | PRN
  Administered 2016-07-09: 4 mg via INTRAVENOUS

## 2016-07-09 MED ORDER — MAGNESIUM HYDROXIDE 400 MG/5ML PO SUSP: 30.0000 mL | Freq: Every day | ORAL | Status: DC | PRN

## 2016-07-09 MED ORDER — FLEET ENEMA 7-19 GM/118ML RE ENEM: 1.0000 | ENEMA | Freq: Once | RECTAL | Status: DC | PRN

## 2016-07-09 SURGICAL SUPPLY — 59 items
AUTOTRANSFUS HAS 1/8 (MISCELLANEOUS) ×3
BATTERY INSTRU NAVIGATION (MISCELLANEOUS) ×12 IMPLANT
BLADE SAW 1 (BLADE) ×3 IMPLANT
BLADE SAW 1/2 (BLADE) ×3 IMPLANT
BONE CEMENT GENTAMICIN (Cement) ×3 IMPLANT
CANISTER SUCT 1200ML W/VALVE (MISCELLANEOUS) ×3 IMPLANT
CANISTER SUCT 3000ML (MISCELLANEOUS) ×6 IMPLANT
CAPT KNEE TOTAL 3 ATTUNE ×3 IMPLANT
CATH TRAY METER 16FR LF (MISCELLANEOUS) ×3 IMPLANT
CEMENT BONE GENTAMICIN 40 (Cement) ×1 IMPLANT
COOLER POLAR GLACIER W/PUMP (MISCELLANEOUS) ×3 IMPLANT
CUFF TOURN 24 STER (MISCELLANEOUS) IMPLANT
CUFF TOURN 30 STER DUAL PORT (MISCELLANEOUS) ×3 IMPLANT
DRAPE SHEET LG 3/4 BI-LAMINATE (DRAPES) ×3 IMPLANT
DRSG DERMACEA 8X12 NADH (GAUZE/BANDAGES/DRESSINGS) ×3 IMPLANT
DRSG OPSITE POSTOP 4X14 (GAUZE/BANDAGES/DRESSINGS) ×3 IMPLANT
DRSG TEGADERM 4X4.75 (GAUZE/BANDAGES/DRESSINGS) ×3 IMPLANT
DURAPREP 26ML APPLICATOR (WOUND CARE) ×6 IMPLANT
ELECT CAUTERY BLADE 6.4 (BLADE) ×3 IMPLANT
ELECT REM PT RETURN 9FT ADLT (ELECTROSURGICAL) ×3
ELECTRODE REM PT RTRN 9FT ADLT (ELECTROSURGICAL) ×1 IMPLANT
EX-PIN ORTHOLOCK NAV 4X150 (PIN) ×6 IMPLANT
GLOVE BIOGEL M STRL SZ7.5 (GLOVE) ×6 IMPLANT
GLOVE INDICATOR 8.0 STRL GRN (GLOVE) ×3 IMPLANT
GLOVE SURG 9.0 ORTHO LTXF (GLOVE) ×3 IMPLANT
GLOVE SURG ORTHO 9.0 STRL STRW (GLOVE) ×3 IMPLANT
GOWN STRL REUS W/ TWL LRG LVL3 (GOWN DISPOSABLE) ×2 IMPLANT
GOWN STRL REUS W/TWL 2XL LVL3 (GOWN DISPOSABLE) ×3 IMPLANT
GOWN STRL REUS W/TWL LRG LVL3 (GOWN DISPOSABLE) ×4
HANDPIECE INTERPULSE COAX TIP (DISPOSABLE) ×2
HOLDER FOLEY CATH W/STRAP (MISCELLANEOUS) ×3 IMPLANT
HOOD PEEL AWAY FLYTE STAYCOOL (MISCELLANEOUS) ×6 IMPLANT
KIT RM TURNOVER STRD PROC AR (KITS) ×3 IMPLANT
KNIFE SCULPS 14X20 (INSTRUMENTS) ×3 IMPLANT
LABEL OR SOLS (LABEL) ×3 IMPLANT
NDL SAFETY 18GX1.5 (NEEDLE) ×3 IMPLANT
NEEDLE SPNL 20GX3.5 QUINCKE YW (NEEDLE) ×3 IMPLANT
NS IRRIG 500ML POUR BTL (IV SOLUTION) ×3 IMPLANT
PACK TOTAL KNEE (MISCELLANEOUS) ×3 IMPLANT
PAD WRAPON POLAR KNEE (MISCELLANEOUS) ×1 IMPLANT
PIN FIXATION 1/8DIA X 3INL (PIN) ×3 IMPLANT
SET HNDPC FAN SPRY TIP SCT (DISPOSABLE) ×1 IMPLANT
SOL .9 NS 3000ML IRR  AL (IV SOLUTION) ×2
SOL .9 NS 3000ML IRR UROMATIC (IV SOLUTION) ×1 IMPLANT
SOL PREP PVP 2OZ (MISCELLANEOUS) ×3
SOLUTION PREP PVP 2OZ (MISCELLANEOUS) ×1 IMPLANT
SPONGE DRAIN TRACH 4X4 STRL 2S (GAUZE/BANDAGES/DRESSINGS) ×3 IMPLANT
STAPLER SKIN PROX 35W (STAPLE) ×3 IMPLANT
SUCTION FRAZIER HANDLE 10FR (MISCELLANEOUS) ×2
SUCTION TUBE FRAZIER 10FR DISP (MISCELLANEOUS) ×1 IMPLANT
SUT VIC AB 0 CT1 36 (SUTURE) ×3 IMPLANT
SUT VIC AB 1 CT1 36 (SUTURE) ×6 IMPLANT
SUT VIC AB 2-0 CT2 27 (SUTURE) ×3 IMPLANT
SYR 20CC LL (SYRINGE) ×3 IMPLANT
SYR 30ML LL (SYRINGE) ×6 IMPLANT
SYSTEM AUTOTRANSFUS DUAL TROCR (MISCELLANEOUS) ×1 IMPLANT
TOWEL OR 17X26 4PK STRL BLUE (TOWEL DISPOSABLE) ×3 IMPLANT
TOWER CARTRIDGE SMART MIX (DISPOSABLE) ×3 IMPLANT
WRAPON POLAR PAD KNEE (MISCELLANEOUS) ×3

## 2016-07-09 NOTE — Anesthesia Procedure Notes (Signed)
Date/Time: 07/09/2016 7:35 AM Performed by: Ginger CarneMICHELET, Nessie Nong Pre-anesthesia Checklist: Patient identified, Emergency Drugs available, Suction available, Patient being monitored and Timeout performed Patient Re-evaluated:Patient Re-evaluated prior to inductionOxygen Delivery Method: Simple face mask

## 2016-07-09 NOTE — Anesthesia Preprocedure Evaluation (Signed)
Anesthesia Evaluation  Patient identified by MRN, date of birth, ID band Patient awake    Reviewed: Allergy & Precautions, NPO status , Patient's Chart, lab work & pertinent test results  Airway Mallampati: III  TM Distance: >3 FB     Dental  (+) Upper Dentures, Partial Lower   Pulmonary shortness of breath and with exertion,    Pulmonary exam normal        Cardiovascular + CAD and + Past MI  Normal cardiovascular exam     Neuro/Psych negative neurological ROS  negative psych ROS   GI/Hepatic Neg liver ROS, GERD  Medicated and Controlled,  Endo/Other  Hypothyroidism   Renal/GU   negative genitourinary   Musculoskeletal negative musculoskeletal ROS (+)   Abdominal Normal abdominal exam  (+)   Peds negative pediatric ROS (+)  Hematology negative hematology ROS (+)   Anesthesia Other Findings   Reproductive/Obstetrics                             Anesthesia Physical Anesthesia Plan  ASA: III  Anesthesia Plan: Spinal   Post-op Pain Management:    Induction: Intravenous  Airway Management Planned: Nasal Cannula  Additional Equipment:   Intra-op Plan:   Post-operative Plan:   Informed Consent: I have reviewed the patients History and Physical, chart, labs and discussed the procedure including the risks, benefits and alternatives for the proposed anesthesia with the patient or authorized representative who has indicated his/her understanding and acceptance.   Dental advisory given  Plan Discussed with: CRNA and Surgeon  Anesthesia Plan Comments:         Anesthesia Quick Evaluation

## 2016-07-09 NOTE — H&P (Signed)
The patient has been re-examined, and the chart reviewed, and there have been no interval changes to the documented history and physical.    The risks, benefits, and alternatives have been discussed at length. The patient expressed understanding of the risks benefits and agreed with plans for surgical intervention.  James P. Hooten, Jr. M.D.    

## 2016-07-09 NOTE — Brief Op Note (Signed)
07/09/2016  11:04 AM  PATIENT:  Cindy Harper  70 y.o. female  PRE-OPERATIVE DIAGNOSIS:  OSTEOARTHRITIS of the left knee  POST-OPERATIVE DIAGNOSIS:  Same  PROCEDURE:  Procedure(s): COMPUTER ASSISTED TOTAL KNEE ARTHROPLASTY (Left)  SURGEON:  Surgeon(s) and Role:    * Donato HeinzJames P Hooten, MD - Primary  ASSISTANTS: Van ClinesJon Wolfe, PA   ANESTHESIA:   spinal  EBL:  Total I/O In: 1000 [I.V.:1000] Out: 650 [Urine:625; Blood:25]  BLOOD ADMINISTERED:none  DRAINS: 2 medium drains to a reinfusion system   LOCAL MEDICATIONS USED:  MARCAINE    and OTHER Exparel  SPECIMEN:  No Specimen  DISPOSITION OF SPECIMEN:  N/A  COUNTS:  YES  TOURNIQUET:   91 minutes  DICTATION: .Dragon Dictation  PLAN OF CARE: Admit to inpatient   PATIENT DISPOSITION:  PACU - hemodynamically stable.   Delay start of Pharmacological VTE agent (>24hrs) due to surgical blood loss or risk of bleeding: yes

## 2016-07-09 NOTE — Progress Notes (Signed)
Patient BP 97/48. MD notified. Ordered 500cc bolus.  Harvie HeckMelanie Maudean Hoffmann, RN

## 2016-07-09 NOTE — Progress Notes (Signed)
PT Cancellation Note  Patient Details Name: Cindy Harper MRN: [id number] DOB: 1946-01-01   Cancelled Treatment:    Reason Eval/Treat Not Completed: Other (comment). Consult received and chart reviewed. Eval attempted, however pt still with spinal block limiting OOB mobility. Numbness noted in B feet. Not safe for ambulation at this time. RN notified and agreeable to dangling EOB later this evening. Will re-attempt evaluation next date.   Abimelec Grochowski 07/09/2016, 3:54 PM Elizabeth PalauStephanie Christohper Dube, PT, DPT [id number]

## 2016-07-09 NOTE — Op Note (Signed)
OPERATIVE NOTE  DATE OF SURGERY:  07/09/2016  PATIENT NAME:  Cindy Harper   DOB: 22-Aug-1946  MRN: [id number]  PRE-OPERATIVE DIAGNOSIS: Degenerative arthrosis of the left knee, primary  POST-OPERATIVE DIAGNOSIS:  Same  PROCEDURE:  Left total knee arthroplasty using computer-assisted navigation  SURGEON:  Jena GaussJames P Hooten, Jr. M.D.  ASSISTANT:  Van ClinesJon Wolfe, PA (present and scrubbed throughout the case, critical for assistance with exposure, retraction, instrumentation, and closure)  ANESTHESIA: spinal  ESTIMATED BLOOD LOSS: 25 mL  FLUIDS REPLACED: 1000 mL of crystalloid  TOURNIQUET TIME: 91 minutes  DRAINS: 2 medium drains to a reinfusion system  SOFT TISSUE RELEASES: Anterior cruciate ligament, posterior cruciate ligament, deep medial collateral ligament, patellofemoral ligament  IMPLANTS UTILIZED: DePuy Attune size 7 posterior stabilized femoral component (cemented), size 6 rotating platform tibial component (cemented), 38 mm medialized dome patella (cemented), and a 6 mm stabilized rotating platform polyethylene insert.  INDICATIONS FOR SURGERY: Cindy Harper is a 70 y.o. year old female with a long history of progressive knee pain. X-rays demonstrated severe degenerative changes in tricompartmental fashion. The patient had not seen any significant improvement despite conservative nonsurgical intervention. After discussion of the risks and benefits of surgical intervention, the patient expressed understanding of the risks benefits and agree with plans for total knee arthroplasty.   The risks, benefits, and alternatives were discussed at length including but not limited to the risks of infection, bleeding, nerve injury, stiffness, blood clots, the need for revision surgery, cardiopulmonary complications, among others, and they were willing to proceed.  PROCEDURE IN DETAIL: The patient was brought into the operating room and, after adequate spinal anesthesia was achieved, a  tourniquet was placed on the patient's upper thigh. The patient's knee and leg were cleaned and prepped with alcohol and DuraPrep and draped in the usual sterile fashion. A "timeout" was performed as per usual protocol. The lower extremity was exsanguinated using an Esmarch, and the tourniquet was inflated to 300 mmHg. An anterior longitudinal incision was made followed by a standard mid vastus approach. The deep fibers of the medial collateral ligament were elevated in a subperiosteal fashion off of the medial flare of the tibia so as to maintain a continuous soft tissue sleeve. The patella was subluxed laterally and the patellofemoral ligament was incised. Inspection of the knee demonstrated severe degenerative changes with full-thickness loss of articular cartilage. Osteophytes were debrided using a rongeur. Anterior and posterior cruciate ligaments were excised. Two 4.0 mm Schanz pins were inserted in the femur and into the tibia for attachment of the array of trackers used for computer-assisted navigation. Hip center was identified using a circumduction technique. Distal landmarks were mapped using the computer. The distal femur and proximal tibia were mapped using the computer. The distal femoral cutting guide was positioned using computer-assisted navigation so as to achieve a 5 distal valgus cut. The femur was sized and it was felt that a size 7 femoral component was appropriate. A size 7 femoral cutting guide was positioned and the anterior cut was performed and verified using the computer. This was followed by completion of the posterior and chamfer cuts. Femoral cutting guide for the central box was then positioned in the center box cut was performed.  Attention was then directed to the proximal tibia. Medial and lateral menisci were excised. The extramedullary tibial cutting guide was positioned using computer-assisted navigation so as to achieve a 0 varus-valgus alignment and 3 posterior slope. The  cut was performed and verified using the computer.  The proximal tibia was sized and it was felt that a size 6 tibial tray was appropriate. Tibial and femoral trials were inserted followed by insertion of a 6 mm polyethylene insert. This allowed for excellent mediolateral soft tissue balancing both in flexion and in full extension. Finally, the patella was cut and prepared so as to accommodate a 38 mm medialized dome patella. A patella trial was placed and the knee was placed through a range of motion with excellent patellar tracking appreciated. The femoral trial was removed after debridement of posterior osteophytes. The central post-hole for the tibial component was reamed followed by insertion of a keel punch. Tibial trials were then removed. Cut surfaces of bone were irrigated with copious amounts of normal saline with antibiotic solution using pulsatile lavage and then suctioned dry. Polymethylmethacrylate cement with gentamicin was prepared in the usual fashion using a vacuum mixer. Cement was applied to the cut surface of the proximal tibia as well as along the undersurface of a size 6 rotating platform tibial component. Tibial component was positioned and impacted into place. Excess cement was removed using Personal assistant. Cement was then applied to the cut surfaces of the femur as well as along the posterior flanges of the size 7 femoral component. The femoral component was positioned and impacted into place. Excess cement was removed using Personal assistant. A 6 mm polyethylene trial was inserted and the knee was brought into full extension with steady axial compression applied. Finally, cement was applied to the backside of a 38 mm medialized dome patella and the patellar component was positioned and patellar clamp applied. Excess cement was removed using Personal assistant. After adequate curing of the cement, the tourniquet was deflated after a total tourniquet time of 91 minutes. Hemostasis was achieved  using electrocautery. The knee was irrigated with copious amounts of normal saline with antibiotic solution using pulsatile lavage and then suctioned dry. 20 mL of 1.3% Exparel and 60 mL of 0.25% Marcaine in 40 mL of normal saline was injected along the posterior capsule, medial and lateral gutters, and along the arthrotomy site. A 6 mm stabilized rotating platform polyethylene insert was inserted and the knee was placed through a range of motion with excellent mediolateral soft tissue balancing appreciated and excellent patellar tracking noted. 2 medium drains were placed in the wound bed and brought out through separate stab incisions to be attached to a reinfusion system. The medial parapatellar portion of the incision was reapproximated using interrupted sutures of #1 Vicryl. Subcutaneous tissue was approximated in layers using first #0 Vicryl followed #2-0 Vicryl. The skin was approximated with skin staples. A sterile dressing was applied.  The patient tolerated the procedure well and was transported to the recovery room in stable condition.    James P. Angie Fava., M.D.

## 2016-07-09 NOTE — Transfer of Care (Signed)
Immediate Anesthesia Transfer of Care Note  Patient: Cindy Harper  Procedure(s) Performed: Procedure(s): COMPUTER ASSISTED TOTAL KNEE ARTHROPLASTY (Left)  Patient Location: PACU  Anesthesia Type:Spinal  Level of Consciousness: awake, alert  and oriented  Airway & Oxygen Therapy: Patient Spontanous Breathing and Patient connected to face mask oxygen  Post-op Assessment: Report given to RN and Post -op Vital signs reviewed and stable  Post vital signs: Reviewed and stable  Last Vitals:  Vitals:   07/09/16 0610  BP: 128/65  Pulse: 74  Resp: 18  Temp: 36.2 C    Last Pain:  Vitals:   07/09/16 0610  TempSrc: Oral  PainSc: 5          Complications: No apparent anesthesia complications

## 2016-07-09 NOTE — Anesthesia Procedure Notes (Signed)
Spinal  Patient location during procedure: OR Start time: 07/09/2016 7:25 AM End time: 07/09/2016 7:37 AM Staffing Anesthesiologist: Alvin Critchley Resident/CRNA: Johnna Acosta Performed: anesthesiologist and resident/CRNA  Preanesthetic Checklist Completed: patient identified, site marked, surgical consent, pre-op evaluation, timeout performed, IV checked, risks and benefits discussed and monitors and equipment checked Spinal Block Patient position: sitting Prep: Betadine Patient monitoring: heart rate, continuous pulse ox, blood pressure and cardiac monitor Approach: left paramedian Location: L3-4 Injection technique: single-shot Needle Needle type: Introducer, Quincke and Whitacre  Needle gauge: 24 G Needle length: 9 cm Assessment Sensory level: T10 Additional Notes Negative paresthesia. Negative blood return. Positive free-flowing CSF. Expiration date of kit checked and confirmed. Patient tolerated procedure well, without complications.

## 2016-07-09 NOTE — NC FL2 (Signed)
[place] MEDICAID FL2 LEVEL OF CARE SCREENING TOOL     IDENTIFICATION  Patient Name: Cindy Harper Birthdate: 01-May-1946 Sex: female Admission Date (Current Location): 07/09/2016  [place] and [place] Number:  Chiropodist and Address:  [facility], [address], [place], [place] [address]      Provider Number: [id number]  Attending Physician Name and Address:  Donato Heinz, MD  Relative Name and Phone Number:       Current Level of Care: Hospital Recommended Level of Care: Skilled Nursing Facility Prior Approval Number:    Date Approved/Denied:   PASRR Number:  [id number])  Discharge Plan: SNF    Current Diagnoses: Patient Active Problem List   Diagnosis Date Noted  . 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   GERD (gastroesophageal reflux disease)    Thyroid disease    Hyperlipidemia, unspecified    Osteoarthritis    Coronary artery disease    Heart attack       Orientation RESPIRATION BLADDER Height & Weight     Time, Self, Situation, Place  Normal Continent Weight: 201 lb (91.2 kg) Height:  5\' 6"  (167.6 cm)  BEHAVIORAL SYMPTOMS/MOOD NEUROLOGICAL BOWEL NUTRITION STATUS   (none)  (none) Continent Diet (Diet: Clear Liquid )  AMBULATORY STATUS COMMUNICATION OF NEEDS Skin   Extensive Assist Verbally Surgical wounds (Incision: Left Knee )                       Personal Care Assistance Level of Assistance  Bathing, Feeding, Dressing Bathing Assistance: Limited assistance Feeding assistance: Independent Dressing Assistance: Limited assistance     Functional Limitations Info  Sight, Hearing, Speech Sight Info: Impaired Hearing Info: Adequate Speech Info: Adequate    SPECIAL CARE FACTORS FREQUENCY  PT (By licensed PT), OT (By licensed OT)     PT Frequency:  (5) OT Frequency:  (5)            Contractures      Additional Factors Info  Code Status, Allergies Code Status Info:  (Full Code.  ) Allergies Info:  (No Known Allergies. )           Current Medications (07/09/2016):  This is the current hospital active medication list Current Facility-Administered Medications  Medication Dose Route Frequency Provider Last Rate Last Dose  . 0.9 %  sodium chloride infusion   Intravenous Continuous Donato Heinz, MD 100 mL/hr at 07/09/16 1143 1,000 mL at 07/09/16 1143  . acetaminophen (OFIRMEV) IV 1,000 mg  1,000 mg Intravenous Q6H Donato Heinz, MD   1,000 mg at 07/09/16 1345  . acetaminophen (TYLENOL) tablet 650 mg  650 mg Oral Q6H PRN Donato Heinz, MD       Or  . acetaminophen (TYLENOL) suppository 650 mg  650 mg Rectal Q6H PRN Donato Heinz, MD      . alum & mag hydroxide-simeth (MAALOX/MYLANTA) 200-200-20 MG/5ML suspension 30 mL  30 mL Oral Q4H PRN Donato Heinz, MD      . bisacodyl (DULCOLAX) suppository 10 mg  10 mg Rectal Daily PRN Donato Heinz, MD      . ceFAZolin (ANCEF) IVPB 2g/100 mL premix  2 g Intravenous Q6H Donato Heinz, MD   2 g at 07/09/16 1453  . celecoxib (CELEBREX) capsule 200 mg  200 mg Oral Q12H Donato Heinz, MD   200 mg at 07/09/16 1332  . diphenhydrAMINE (BENADRYL) 12.5 MG/5ML elixir 12.5-25 mg  12.5-25  mg Oral Q4H PRN Donato HeinzJames P Hooten, MD      . Melene Muller[START ON 07/10/2016] enoxaparin (LOVENOX) injection 30 mg  30 mg Subcutaneous Q12H Donato HeinzJames P Hooten, MD      . ferrous sulfate tablet 325 mg  325 mg Oral BID WC Donato HeinzJames P Hooten, MD      . Melene Muller[START ON 07/10/2016] levothyroxine (SYNTHROID, LEVOTHROID) tablet 100 mcg  100 mcg Oral QAC breakfast Donato HeinzJames P Hooten, MD      . magnesium hydroxide (MILK OF MAGNESIA) suspension 30 mL  30 mL Oral Daily PRN Donato HeinzJames P Hooten, MD      . menthol-cetylpyridinium (CEPACOL) lozenge 3 mg  1 lozenge Oral PRN Donato HeinzJames P Hooten, MD       Or  . phenol (CHLORASEPTIC) mouth spray 1 spray  1 spray Mouth/Throat PRN Donato HeinzJames P Hooten, MD      . metoCLOPramide (REGLAN) tablet 10 mg  10 mg Oral TID AC & HS Donato HeinzJames P Hooten, MD   10 mg at 07/09/16 1332  .  morphine 2 MG/ML injection 2 mg  2 mg Intravenous Q2H PRN Donato HeinzJames P Hooten, MD      . ondansetron (ZOFRAN) tablet 4 mg  4 mg Oral Q6H PRN Donato HeinzJames P Hooten, MD       Or  . ondansetron (ZOFRAN) injection 4 mg  4 mg Intravenous Q6H PRN Donato HeinzJames P Hooten, MD      . oxyCODONE (Oxy IR/ROXICODONE) immediate release tablet 5-10 mg  5-10 mg Oral Q4H PRN Donato HeinzJames P Hooten, MD      . pantoprazole (PROTONIX) EC tablet 40 mg  40 mg Oral BID Donato HeinzJames P Hooten, MD   40 mg at 07/09/16 1332  . senna-docusate (Senokot-S) tablet 1 tablet  1 tablet Oral BID Donato HeinzJames P Hooten, MD      . sodium phosphate (FLEET) 7-19 GM/118ML enema 1 enema  1 enema Rectal Once PRN Donato HeinzJames P Hooten, MD      . traMADol Janean Sark(ULTRAM) tablet 50-100 mg  50-100 mg Oral Q4H PRN Donato HeinzJames P Hooten, MD   50 mg at 07/09/16 1345  . vitamin B-12 (CYANOCOBALAMIN) tablet 100 mcg  100 mcg Oral Daily Donato HeinzJames P Hooten, MD         Discharge Medications: Please see discharge summary for a list of discharge medications.  Relevant Imaging Results:  Relevant Lab Results:   Additional Information  (SSN: [id number])  Chanley Mcenery, Darleen CrockerBailey M, LCSW

## 2016-07-09 NOTE — Care Management Note (Signed)
Case Management Note  Patient Details  Name: NIKOLA BLACKSTON MRN: [id number] Date of Birth: 11-05-1945  Subjective/Objective:  POD left TKA. Met with patient and son, Rachael Ferrie ([phone]),  at bedside.  PT come in and was unable to work with patient due to numbness. Prior to admission, patient was living home alone. She has been independent with adls and driving. She will be going home with her son and dil, Abe People and Janace Hoard [phone]) to stay at discharge. She has brought her rolling walker to the hospital for PT evaluation. She uses [facility] [place] [phone] . Called Lovneox 40 mg #14, no refills.   Offered choice of home health agencies and she prefers [facility].          Action/Plan: Referral to [facility]. Lovenox called in.   Expected Discharge Date:                  Expected Discharge Plan:  [place]  In-House Referral:     Discharge planning Services  CM Consult  Post Acute Care Choice:  Home Health Choice offered to:  Patient  DME Arranged:    DME Agency:     HH Arranged:  PT [place]:  [facility] (now [facility])  Status of Service:  In process, will continue to follow  If discussed at Long Length of Stay Meetings, dates discussed:    Additional Comments:  Jolly Mango, RN 07/09/2016, 3:48 PM

## 2016-07-09 NOTE — Progress Notes (Signed)
Pt tolerated sitting on side of bed with no complaints.

## 2016-07-10 ENCOUNTER — Encounter: Payer: Self-pay | Admitting: Orthopedic Surgery

## 2016-07-10 LAB — BASIC METABOLIC PANEL
Anion gap: 5 (ref 5–15)
BUN: 14 mg/dL (ref 6–20)
CHLORIDE: 110 mmol/L (ref 101–111)
CO2: 24 mmol/L (ref 22–32)
CREATININE: 1.02 mg/dL — AB (ref 0.44–1.00)
Calcium: 8.7 mg/dL — ABNORMAL LOW (ref 8.9–10.3)
GFR, EST NON AFRICAN AMERICAN: 54 mL/min — AB (ref >60)
Glucose, Bld: 131 mg/dL — ABNORMAL HIGH (ref 65–99)
POTASSIUM: 4.1 mmol/L (ref 3.5–5.1)
SODIUM: 139 mmol/L (ref 135–145)

## 2016-07-10 LAB — CBC
HCT: 35 % (ref 35.0–47.0)
HEMOGLOBIN: 11.5 g/dL — AB (ref 12.0–16.0)
MCH: 30.7 pg (ref 26.0–34.0)
MCHC: 32.8 g/dL (ref 32.0–36.0)
MCV: 93.6 fL (ref 80.0–100.0)
PLATELETS: 205 10*3/uL (ref 150–440)
RBC: 3.74 MIL/uL — AB (ref 3.80–5.20)
RDW: 14.2 % (ref 11.5–14.5)
WBC: 11.8 10*3/uL — ABNORMAL HIGH (ref 3.6–11.0)

## 2016-07-10 MED ORDER — SODIUM CHLORIDE 0.9 % IV BOLUS (SEPSIS)
500.0000 mL | Freq: Once | INTRAVENOUS | Status: AC
  Administered 2016-07-10: 500 mL via INTRAVENOUS

## 2016-07-10 NOTE — Progress Notes (Signed)
Clinical Social Worker (CSW) received SNF consult. PT is recommending home health. RN case manager is aware of above. Please reconsult if future social work needs arise. CSW signing off.   Joseangel Nettleton, LCSW [phone] 

## 2016-07-10 NOTE — Progress Notes (Signed)
Physical Therapy Treatment Patient Details Name: Cindy Harper MRN: [id number] DOB: 1945/11/17 Today's Date: 07/10/2016    History of Present Illness Pt. is a 70 y.o.  female who was admitted who was admitted for a LTKR.     PT Comments    Pt improving with therapy and able to ambulate with improved fluid gait pattern this date. Pt ambulated around RN station this date. Good endurance with all there-ex. Safe technique performed. Plan to progress to stair training next date. Pt very motivated to perform therapy.  Follow Up Recommendations  Home health PT     Equipment Recommendations  None recommended by PT    Recommendations for Other Services       Precautions / Restrictions Precautions Precautions: Fall Restrictions Weight Bearing Restrictions: Yes LLE Weight Bearing: Weight bearing as tolerated    Mobility  Bed Mobility Overal bed mobility: Needs Assistance Bed Mobility: Supine to Sit     Supine to sit: Min assist     General bed mobility comments: not performed as pt received in recliner  Transfers Overall transfer level: Needs assistance Equipment used: Rolling walker (2 wheeled) Transfers: Sit to/from Stand Sit to Stand: Min guard         General transfer comment: safe technique performed with cues to push from seated surface. Once standing, pt stands with upright posture. RW used  Ambulation/Gait Ambulation/Gait assistance: Min Government social research officer (Feet): 200 Feet Assistive device: Rolling walker (2 wheeled) Gait Pattern/deviations: Step-through pattern     General Gait Details: ambulated using reciprocal gait pattern and upright posture. Cues given for keeping head up, looking forward. Pt able to progress to symmetrical step length. Increased gait speed noted this date.   Stairs            Wheelchair Mobility    Modified Rankin (Stroke Patients Only)       Balance                                     Cognition Arousal/Alertness: Awake/alert Behavior During Therapy: WFL for tasks assessed/performed Overall Cognitive Status: Within Functional Limits for tasks assessed                      Exercises Total Joint Exercises Goniometric ROM: L knee AROM: 0-85 degrees Other Exercises Other Exercises: Seated ther-ex performed including L LE ankle pumps, quad sets, SLRs, hip abd/add, and LAQ. All ther-ex performed x 10 reps and cga. Safe technique performed. Other Exercises: Pt ambulated to bathroom and observed pt sit on BSC. Daughter in law present and agreeable to assist patient in bathroom.    General Comments        Pertinent Vitals/Pain Pain Assessment: Faces Pain Score: 0-No pain Faces Pain Scale: Hurts a little bit Pain Location: L knee Pain Descriptors / Indicators: Operative site guarding Pain Intervention(s): Limited activity within patient's tolerance;Monitored during session;Ice applied;Repositioned    Home Living Family/patient expects to be discharged to:: Private residence Living Arrangements: Alone Available Help at Discharge: Family Type of Home: House Home Access: Stairs to enter Entrance Stairs-Rails: Right Home Layout: One level Home Equipment: Environmental consultant - 2 wheels      Prior Function Level of Independence: Independent with assistive device(s)          PT Goals (current goals can now be found in the care plan section) Acute Rehab PT Goals Patient Stated Goal: To  return home. PT Goal Formulation: With patient Time For Goal Achievement: 07/24/16 Potential to Achieve Goals: Good Progress towards PT goals: Progressing toward goals    Frequency    BID      PT Plan Current plan remains appropriate    Co-evaluation             End of Session Equipment Utilized During Treatment: Gait belt Activity Tolerance: Patient tolerated treatment well Patient left:  (in bathroom)     Time: 1355-1418 PT Time Calculation (min) (ACUTE ONLY): 23  min  Charges:  $Gait Training: 8-22 mins $Therapeutic Exercise: 8-22 mins                    G Codes:      Khalil Szczepanik 07/10/2016, 2:48 PM Elizabeth PalauStephanie Fidela Cieslak, PT, DPT [phone]

## 2016-07-10 NOTE — Evaluation (Signed)
Physical Therapy Evaluation Patient Details Name: Cindy Harper MRN: [id number] DOB: 09/20/1945 Today's Date: 07/10/2016   History of Present Illness  Pt. is a 70 y.o.  female who was admitted who was admitted for a LTKR.   Clinical Impression  Pt is a pleasant 70 year old female who was admitted for L TKR. Pt performs bed mobility with min assist and transfers/ambulation with cga and RW. Pt demonstrates deficits with strength/mobility/pain. Pt able to perform SLRs without assistance, does not require KI at this time. Would benefit from skilled PT to address above deficits and promote optimal return to PLOF. Recommend transition to HHPT upon discharge from acute hospitalization.       Follow Up Recommendations Home health PT    Equipment Recommendations       Recommendations for Other Services       Precautions / Restrictions Precautions Precautions: Fall Restrictions Weight Bearing Restrictions: Yes LLE Weight Bearing: Weight bearing as tolerated      Mobility  Bed Mobility Overal bed mobility: Needs Assistance Bed Mobility: Supine to Sit     Supine to sit: Min assist     General bed mobility comments: bed mobility performed with safe technique. Pt able use bed rail to assist for sitting at EOB. Once seated at EOB, able to sit with supervision  Transfers Overall transfer level: Needs assistance Equipment used: Rolling walker (2 wheeled) Transfers: Sit to/from Stand Sit to Stand: Min guard         General transfer comment: safe technique performed with cues to push from seated surface. Once standing, able to stand with upright posture  Ambulation/Gait Ambulation/Gait assistance: Min guard Ambulation Distance (Feet): 50 Feet Assistive device: Rolling walker (2 wheeled) Gait Pattern/deviations: Step-to pattern     General Gait Details: ambulated with slow step to gait pattern and upright posture. Slight antalgic gait noted with heavy B UE assistance on RW.  Therapist adjusted RW to correct height for ambulation  Stairs            Wheelchair Mobility    Modified Rankin (Stroke Patients Only)       Balance                                             Pertinent Vitals/Pain Pain Assessment: 0-10 Pain Score: 0-No pain Pain Location: L knee Pain Descriptors / Indicators: Operative site guarding Pain Intervention(s): Limited activity within patient's tolerance;Premedicated before session;Ice applied    Home Living Family/patient expects to be discharged to:: Private residence Living Arrangements: Alone Available Help at Discharge: Family Type of Home: House Home Access: Stairs to enter Entrance Stairs-Rails: Right Entrance Stairs-Number of Steps: 7 Home Layout: One level Home Equipment: Environmental consultantWalker - 2 wheels      Prior Function Level of Independence: Independent with assistive device(s)               Hand Dominance   Dominant Hand: Right    Extremity/Trunk Assessment   Upper Extremity Assessment: Overall WFL for tasks assessed           Lower Extremity Assessment: Generalized weakness (L LE grossly 3/5; R LE grossly 5/5)         Communication   Communication: No difficulties  Cognition Arousal/Alertness: Awake/alert Behavior During Therapy: WFL for tasks assessed/performed Overall Cognitive Status: Within Functional Limits for tasks assessed  General Comments      Exercises Total Joint Exercises Goniometric ROM: L knee AROM: 0-85 degrees Other Exercises Other Exercises: Supine ther-ex performed on L LE including ankle pumps, quad sets, SLRs, hip ab/add, and seated knee flexion stretches. ALl ther-ex performed x 10 reps with cga and safe technique   Assessment/Plan    PT Assessment Patient needs continued PT services  PT Problem List Decreased strength;Decreased balance;Decreased mobility;Pain          PT Treatment Interventions Gait  training;Therapeutic exercise    PT Goals (Current goals can be found in the Care Plan section)  Acute Rehab PT Goals Patient Stated Goal: To return home. PT Goal Formulation: With patient Time For Goal Achievement: 07/24/16 Potential to Achieve Goals: Good    Frequency BID   Barriers to discharge        Co-evaluation               End of Session Equipment Utilized During Treatment: Gait belt Activity Tolerance: Patient tolerated treatment well Patient left: in chair;with chair alarm set Nurse Communication: Mobility status         Time: 0822-0901 PT Time Calculation (min) (ACUTE ONLY): 39 min   Charges:   PT Evaluation $PT Eval Moderate Complexity: 1 Procedure PT Treatments $Therapeutic Exercise: 8-22 mins   PT G Codes:        Jeiry Birnbaum Jul 11, 2016, 1:32 PM  Elizabeth Palau, PT, DPT [phone]

## 2016-07-10 NOTE — Anesthesia Postprocedure Evaluation (Signed)
Anesthesia Post Note  Patient: Cindy Harper  Procedure(s) Performed: Procedure(s) (LRB): COMPUTER ASSISTED TOTAL KNEE ARTHROPLASTY (Left)  Patient location during evaluation: Nursing Unit Anesthesia Type: Spinal Level of consciousness: awake and alert and oriented Pain management: satisfactory to patient Vital Signs Assessment: post-procedure vital signs reviewed and stable Respiratory status: respiratory function stable Cardiovascular status: stable Postop Assessment: no headache, no backache, spinal receding, adequate PO intake, no signs of nausea or vomiting and patient able to bend at knees Anesthetic complications: no    Last Vitals:  Vitals:   07/10/16 0419 07/10/16 0715  BP: (!) 105/51 118/64  Pulse: (!) 59 (!) 56  Resp: 18 18  Temp: 36.4 C 36.4 C    Last Pain:  Vitals:   07/10/16 0715  TempSrc: Oral  PainSc:                  Clydene PughBeane, Mathilde Mcwherter D

## 2016-07-10 NOTE — Evaluation (Signed)
Occupational Therapy Evaluation Patient Details Name: Cindy Harper MRN: [id number] DOB: 12-02-1945 Today's Date: 07/10/2016    History of Present Illness Pt. is a 70 y.o.  female who was admitted who was admitted for a LTKR.    Clinical Impression   Pt. Is a 70 y.o. Female who was admitted for a Left TKR. Pt presents with limited ROM, Pain, weakness, and impaired functional mobility which hinder her ability to complete ADL and IADL tasks. Pt. could benefit from skilled OT services to review A/E use for LE ADLs, to review necessary home modifications, and to improve functional mobility for ADL/IADLs in order to work towards regaining Independence with ADL/IADLs. Pt. Plans to go to son home temporarily upon discharge.    Follow Up Recommendations       Equipment Recommendations       Recommendations for Other Services       Precautions / Restrictions Restrictions Weight Bearing Restrictions: Yes LLE Weight Bearing: Weight bearing as tolerated                                                     ADL Overall ADL's : Needs assistance/impaired Eating/Feeding: Set up   Grooming: Set up               Lower Body Dressing: Moderate assistance                 General ADL Comments: Pt. education was provided about A/E use for LE ADLs.     Vision     Perception     Praxis      Pertinent Vitals/Pain Pain Assessment: 0-10 Pain Score: 4  Pain Location: Left knee     Hand Dominance Right   Extremity/Trunk Assessment Upper Extremity Assessment Upper Extremity Assessment: Overall WFL for tasks assessed           Communication Communication Communication: No difficulties   Cognition Arousal/Alertness: Awake/alert Behavior During Therapy: WFL for tasks assessed/performed Overall Cognitive Status: Within Functional Limits for tasks assessed                     General Comments       Exercises       Shoulder  Instructions      Home Living Family/patient expects to be discharged to:: Private residence Living Arrangements: Alone Available Help at Discharge: Family Type of Home: House Home Access: Stairs to enter [employer] of Steps: 2   Home Layout: One level     Bathroom Shower/Tub: Tub/shower unit;Walk-in shower Shower/tub characteristics: Door   Bathroom Accessibility: Yes How Accessible: Accessible via walker Home Equipment: Walker - 2 wheels          Prior Functioning/Environment Level of Independence: Independent with assistive device(s)                 OT Problem List:     OT Treatment/Interventions:      OT Goals(Current goals can be found in the care plan section) Acute Rehab OT Goals Patient Stated Goal: To return home. OT Goal Formulation: With patient Potential to Achieve Goals: Good  OT Frequency:     Barriers to D/C:            Co-evaluation              End  of Session    Activity Tolerance:  Pt. Tolerated session well Patient left:  In chair, call bell within reach, and alarm in place   Time: 0950-1013 OT Time Calculation (min): 23 min Charges:  OT General Charges $OT Visit: 1 Procedure OT Evaluation $OT Eval Moderate Complexity: 1 Procedure G-Codes:    Cindy MessierElaine Derrin Currey, MS, OTR/L 07/10/2016, 11:21 AM

## 2016-07-10 NOTE — Care Management (Signed)
Cost of Lovenox is $111.77. [place]

## 2016-07-10 NOTE — Progress Notes (Signed)
   Subjective: 1 Day Post-Op Procedure(s) (LRB): COMPUTER ASSISTED TOTAL KNEE ARTHROPLASTY (Left) Patient reports pain as 5 on 0-10 scale.   Patient is well, and has had no acute complaints or problems We will start therapy today.  Plan is to go Home after hospital stay. no nausea and no vomiting Patient denies any chest pains or shortness of breath. Patient sitting up in bed. Looking very well. Voiced no complaints. Objective: Vital signs in last 24 hours: Temp:  [97.4 F (36.3 C)-97.8 F (36.6 C)] 97.5 F (36.4 C) (10/26 0715) Pulse Rate:  [44-76] 56 (10/26 0715) Resp:  [6-20] 18 (10/26 0715) BP: (88-118)/(44-64) 118/64 (10/26 0715) SpO2:  [97 %-100 %] 98 % (10/26 0715) FiO2 (%):  [21 %] 21 % (10/25 1158) Heels are non tender and elevated off the bed using rolled towels Intake/Output from previous day: 10/25 0701 - 10/26 0700 In: 4293.3 [P.O.:1700; I.V.:2293.3; IV Piggyback:200] Out: 1900 [Urine:1675; Drains:200; Blood:25] Intake/Output this shift: Total I/O In: 738.3 [I.V.:738.3] Out: 650 [Urine:650]   Recent Labs  07/10/16 0409  HGB 11.5*    Recent Labs  07/10/16 0409  WBC 11.8*  RBC 3.74*  HCT 35.0  PLT 205    Recent Labs  07/10/16 0409  NA 139  K 4.1  CL 110  CO2 24  BUN 14  CREATININE 1.02*  GLUCOSE 131*  CALCIUM 8.7*   No results for input(s): LABPT, INR in the last 72 hours.  EXAM General - Patient is Alert, Appropriate and Oriented Extremity - Neurologically intact Neurovascular intact Sensation intact distally Intact pulses distally Dorsiflexion/Plantar flexion intact No cellulitis present Compartment soft Dressing - dressing C/D/I Motor Function - intact, moving foot and toes well on exam. Patient able to do straight leg raise on her own.  Past Medical History:  Diagnosis Date  . Dyspnea   . GERD (gastroesophageal reflux disease)   . Heart attack 2005  . Hypothyroidism     Assessment/Plan: 1 Day Post-Op Procedure(s)  (LRB): COMPUTER ASSISTED TOTAL KNEE ARTHROPLASTY (Left) Active Problems:   S/P total knee arthroplasty  Estimated body mass index is 32.44 kg/m as calculated from the following:   Height as of this encounter: 5\' 6"  (1.676 m).   Weight as of this encounter: 91.2 kg (201 lb). Advance diet Up with therapy D/C IV fluids Plan for discharge tomorrow Discharge home with home health  Labs:  Were reviewed DVT Prophylaxis - Lovenox, Foot Pumps and TED hose Weight-Bearing as tolerated to left leg D/C O2 and Pulse OX and try on Room Air  begin working on bowel movement  labs tomorrow morning  Jon R. [facility] Surgery CenterWolfe PA [facility] 07/10/2016, 7:43 AM

## 2016-07-11 LAB — BASIC METABOLIC PANEL
ANION GAP: 5 (ref 5–15)
BUN: 18 mg/dL (ref 6–20)
CALCIUM: 8.6 mg/dL — AB (ref 8.9–10.3)
CO2: 25 mmol/L (ref 22–32)
Chloride: 110 mmol/L (ref 101–111)
Creatinine, Ser: 1.1 mg/dL — ABNORMAL HIGH (ref 0.44–1.00)
GFR, EST AFRICAN AMERICAN: 57 mL/min — AB (ref >60)
GFR, EST NON AFRICAN AMERICAN: 49 mL/min — AB (ref >60)
Glucose, Bld: 97 mg/dL (ref 65–99)
Potassium: 4.4 mmol/L (ref 3.5–5.1)
SODIUM: 140 mmol/L (ref 135–145)

## 2016-07-11 LAB — CBC
HCT: 34 % — ABNORMAL LOW (ref 35.0–47.0)
Hemoglobin: 11.3 g/dL — ABNORMAL LOW (ref 12.0–16.0)
MCH: 31.3 pg (ref 26.0–34.0)
MCHC: 33.3 g/dL (ref 32.0–36.0)
MCV: 93.9 fL (ref 80.0–100.0)
PLATELETS: 201 10*3/uL (ref 150–440)
RBC: 3.62 MIL/uL — ABNORMAL LOW (ref 3.80–5.20)
RDW: 14.3 % (ref 11.5–14.5)
WBC: 8.7 10*3/uL (ref 3.6–11.0)

## 2016-07-11 MED ORDER — TRAMADOL HCL 50 MG PO TABS: 50.0000 mg | ORAL_TABLET | ORAL | 1 refills | Status: DC | PRN

## 2016-07-11 MED ORDER — ENOXAPARIN SODIUM 40 MG/0.4ML ~~LOC~~ SOLN: 40.0000 mg | SUBCUTANEOUS | 0 refills | Status: DC

## 2016-07-11 MED ORDER — OXYCODONE HCL 5 MG PO TABS: 5.0000 mg | ORAL_TABLET | ORAL | 0 refills | Status: DC | PRN

## 2016-07-11 NOTE — Care Management Note (Signed)
Case Management Note  Patient Details  Name: Cindy Harper MRN: [id number] Date of Birth: Jul 30, 1946  Subjective/Objective:   Discharging today                 Action/Plan: [facility] notified of discharge.  Expected Discharge Date:    07/11/2016              Expected Discharge Plan:  Home w Home Health Services  In-House Referral:     Discharge planning Services  CM Consult  Post Acute Care Choice:  Home Health Choice offered to:  Patient  DME Arranged:    DME Agency:     HH Arranged:  [facility]  Status of Service:  In process, will continue to follow  If discussed at Long Length of Stay Meetings, dates discussed:    Additional Comments:  Marily MemosLisa M Maddix Kliewer, RN 07/11/2016, 9:28 AM

## 2016-07-11 NOTE — Progress Notes (Signed)
Pt alert and oriented. Went over Lovenox teaching before bed. Medicated for pain x1 with good results. Pt was able to have bowel movement this shift. Able to sleep in between care.

## 2016-07-11 NOTE — Discharge Instructions (Signed)

## 2016-07-11 NOTE — Progress Notes (Signed)
   Subjective: 2 Days Post-Op Procedure(s) (LRB): COMPUTER ASSISTED TOTAL KNEE ARTHROPLASTY (Left) Patient reports pain as mild.   Patient is well, and has had no acute complaints or problems Continue with physical therapy today.  Plan is to go Home after hospital stay. no nausea and no vomiting Patient denies any chest pains or shortness of breath. Objective: Vital signs in last 24 hours: Temp:  [97.5 F (36.4 C)-97.9 F (36.6 C)] 97.5 F (36.4 C) (10/27 0727) Pulse Rate:  [53-70] 59 (10/27 0727) Resp:  [16-18] 18 (10/27 0727) BP: (91-115)/(35-61) 115/46 (10/27 0727) SpO2:  [96 %-100 %] 99 % (10/27 0727) well approximated incision Heels are non tender and elevated off the bed using rolled towels Intake/Output from previous day: 10/26 0701 - 10/27 0700 In: 1835 [P.O.:600; I.V.:835; IV Piggyback:400] Out: 1075 [Urine:650; Drains:425] Intake/Output this shift: No intake/output data recorded.   Recent Labs  07/10/16 0409 07/11/16 0404  HGB 11.5* 11.3*    Recent Labs  07/10/16 0409 07/11/16 0404  WBC 11.8* 8.7  RBC 3.74* 3.62*  HCT 35.0 34.0*  PLT 205 201    Recent Labs  07/10/16 0409 07/11/16 0404  NA 139 140  K 4.1 4.4  CL 110 110  CO2 24 25  BUN 14 18  CREATININE 1.02* 1.10*  GLUCOSE 131* 97  CALCIUM 8.7* 8.6*   No results for input(s): LABPT, INR in the last 72 hours.  EXAM General - Patient is Alert, Appropriate and Oriented Extremity - Neurologically intact Neurovascular intact Sensation intact distally Intact pulses distally Dorsiflexion/Plantar flexion intact No cellulitis present Compartment soft Dressing - scant drainage Motor Function - intact, moving foot and toes well on exam.    Past Medical History:  Diagnosis Date  . Dyspnea   . GERD (gastroesophageal reflux disease)   . Heart attack 2005  . Hypothyroidism     Assessment/Plan: 2 Days Post-Op Procedure(s) (LRB): COMPUTER ASSISTED TOTAL KNEE ARTHROPLASTY (Left) Active  Problems:   S/P total knee arthroplasty  Estimated body mass index is 32.44 kg/m as calculated from the following:   Height as of this encounter: 5\' 6"  (1.676 m).   Weight as of this encounter: 91.2 kg (201 lb). Up with therapy Discharge home with home health  Labs: Were reviewed DVT Prophylaxis - Lovenox, Foot Pumps and TED hose Weight-Bearing as tolerated to left leg Hemovac was discontinued on today's visit Patient may be discharged to home once she does steps. Please change dressing prior to being discharged Please give the patient 2 extreme honeycomb dressings to take home.  Lynnda ShieldsJon R. [facility] IslandWolfe PA [facility] 07/11/2016, 7:43 AM

## 2016-07-11 NOTE — Discharge Summary (Signed)
Physician Discharge Summary  Patient ID: Cindy Harper MRN: [id number] DOB/AGE: 1945-11-04 70 y.o.  Admit date: 07/09/2016 Discharge date: 07/11/2016  Admission Diagnoses:  OSTEOARTHRITIS   Discharge Diagnoses: Patient Active Problem List   Diagnosis Date Noted  . 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    Past Medical History:  Diagnosis Date  . Dyspnea   . GERD (gastroesophageal reflux disease)   . Heart attack 2005  . Hypothyroidism      Transfusion: Autovac transfusions given doing this admission   Consultants (if any):  no consultations  Discharged Condition: Improved  Hospital Course: Cindy Harper is an 70 y.o. female who was admitted 07/09/2016 with a diagnosis of degenerative arthrosis left knee and went to the operating room on 07/09/2016 and underwent the above named procedures.    Surgeries:Procedure(s): COMPUTER ASSISTED TOTAL KNEE ARTHROPLASTY on 07/09/2016  PRE-OPERATIVE DIAGNOSIS: Degenerative arthrosis of the left knee, primary  POST-OPERATIVE DIAGNOSIS:  Same  PROCEDURE:  Left total knee arthroplasty using computer-assisted navigation  SURGEON:  Jena GaussJames P Hooten, Jr. M.D.  ASSISTANT:  Van ClinesJon Kendal Raffo, PA (present and scrubbed throughout the case, critical for assistance with exposure, retraction, instrumentation, and closure)  ANESTHESIA: spinal  ESTIMATED BLOOD LOSS: 25 mL  FLUIDS REPLACED: 1000 mL of crystalloid  TOURNIQUET TIME: 91 minutes  DRAINS: 2 medium drains to a reinfusion system  SOFT TISSUE RELEASES: Anterior cruciate ligament, posterior cruciate ligament, deep medial collateral ligament, patellofemoral ligament  IMPLANTS UTILIZED: DePuy Attune size 7 posterior stabilized femoral component (cemented), size 6 rotating platform tibial component (cemented), 38 mm medialized dome patella (cemented), and a 6 mm stabilized rotating platform polyethylene insert.  INDICATIONS FOR SURGERY: Cindy Harper is a 70 y.o. year old  female with a long history of progressive knee pain. X-rays demonstrated severe degenerative changes in tricompartmental fashion. The patient had not seen any significant improvement despite conservative nonsurgical intervention. After discussion of the risks and benefits of surgical intervention, the patient expressed understanding of the risks benefits and agree with plans for total knee arthroplasty.   The risks, benefits, and alternatives were discussed at length including but not limited to the risks of infection, bleeding, nerve injury, stiffness, blood clots, the need for revision surgery, cardiopulmonary complications, among others, and they were willing to proceed. Patient tolerated the surgery well. No complications .Patient was taken to PACU where she was stabilized and then transferred to the orthopedic floor.  Patient started on Lovenox 30 mg q 12 hrs. Foot pumps applied bilaterally at 80 mm hgb. Heels elevated off bed with rolled towels. No evidence of DVT. Calves non tender. Negative Homan. Physical therapy started on day #1 for gait training and transfer with OT starting on  day #1 for ADL and assisted devices. Patient has done well with therapy. Ambulated 200 feet upon being discharged. Was able to go up and down 4 steps safely and independently  Patient's IV and Foley were discontinued on day #1 with Hemovac being discontinued on day #2 along with dressing change.   She was given perioperative antibiotics:  Anti-infectives    Start     Dose/Rate Route Frequency Ordered Stop   07/09/16 1400  ceFAZolin (ANCEF) IVPB 2g/100 mL premix     2 g 200 mL/hr over 30 Minutes Intravenous Every 6 hours 07/09/16 1157 07/10/16 0909   07/09/16 0551  ceFAZolin (ANCEF) 2-4 GM/100ML-% IVPB    Comments:  Letta PateKinney, Nicole: cabinet override      07/09/16 0551 07/09/16 0751   07/09/16 0400  ceFAZolin (ANCEF) IVPB 2g/100 mL premix     2 g 200 mL/hr over 30 Minutes Intravenous On call to O.R. 07/09/16  0400 07/09/16 0806    .  She was fitted with AV 1 compression foot pump devices, instructed on heel pumps, early ambulation, and TED stockings bilaterally for DVT prophylaxis.  She benefited maximally from the hospital stay and there were no complications.    Recent vital signs:  Vitals:   07/11/16 0416 07/11/16 0727  BP: (!) 100/56 (!) 115/46  Pulse: 70 (!) 59  Resp: 16 18  Temp: 97.8 F (36.6 C) 97.5 F (36.4 C)    Recent laboratory studies:  Lab Results  Component Value Date   HGB 11.3 (L) 07/11/2016   HGB 11.5 (L) 07/10/2016   HGB 13.6 06/25/2016   Lab Results  Component Value Date   WBC 8.7 07/11/2016   PLT 201 07/11/2016   Lab Results  Component Value Date   INR 0.94 06/25/2016   Lab Results  Component Value Date   NA 140 07/11/2016   K 4.4 07/11/2016   CL 110 07/11/2016   CO2 25 07/11/2016   BUN 18 07/11/2016   CREATININE 1.10 (H) 07/11/2016   GLUCOSE 97 07/11/2016    Discharge Medications:     Medication List    TAKE these medications   acetaminophen 500 MG tablet Commonly known as:  TYLENOL Take 500 mg by mouth every 6 (six) hours as needed for mild pain or headache.   aspirin 81 MG chewable tablet Chew 81 mg by mouth daily.   enoxaparin 40 MG/0.4ML injection Commonly known as:  LOVENOX Inject 0.4 mLs (40 mg total) into the skin daily.   etodolac 400 MG tablet Commonly known as:  LODINE Take 400 mg by mouth 2 (two) times daily.   levothyroxine 100 MCG tablet Commonly known as:  SYNTHROID, LEVOTHROID Take 100 mcg by mouth daily before breakfast.   omeprazole 20 MG tablet Commonly known as:  PRILOSEC OTC Take 20 mg by mouth daily.   oxyCODONE 5 MG immediate release tablet Commonly known as:  Oxy IR/ROXICODONE Take 1-2 tablets (5-10 mg total) by mouth every 4 (four) hours as needed for severe pain or breakthrough pain.   traMADol 50 MG tablet Commonly known as:  ULTRAM Take 1-2 tablets (50-100 mg total) by mouth every 4 (four)  hours as needed for moderate pain.   VITAMIN B-12 PO Take 1 tablet by mouth daily.       Diagnostic Studies: Dg Knee Left Port  Result Date: 07/09/2016 CLINICAL DATA:  Total knee replacement. EXAM: PORTABLE LEFT KNEE - 1-2 VIEW COMPARISON:  MRI 06/04/2014. FINDINGS: Total left knee replacement.  Hardware intact.  Anatomic alignment. IMPRESSION: Total left knee replacement with good anatomic alignment. Electronically Signed   By: Maisie Fus  Register   On: 07/09/2016 11:22   Mm Screening Breast Tomo Bilateral  Result Date: 06/19/2016 CLINICAL DATA:  Screening. EXAM: 2D DIGITAL SCREENING BILATERAL MAMMOGRAM WITH CAD AND ADJUNCT TOMO COMPARISON:  Previous exam(s). ACR Breast Density Category b: There are scattered areas of fibroglandular density. FINDINGS: There are no findings suspicious for malignancy. Images were processed with CAD. IMPRESSION: No mammographic evidence of malignancy. A result letter of this screening mammogram will be mailed directly to the patient. RECOMMENDATION: Screening mammogram in one year. (Code:SM-B-01Y) BI-RADS CATEGORY  1: Negative. Electronically Signed   By: Dalphine Handing M.D.   On: 06/19/2016 14:08    Disposition: 01-Home or Self Care  Discharge Instructions  Diet - low sodium heart healthy    Complete by:  As directed    Increase activity slowly    Complete by:  As directed       Follow-up Information    Jarae Nemmers R., PA On 07/24/2016.   Specialty:  Physician Assistant Why:  at 9:45am Contact information: [address] [facility] [place] [address] [phone]        Donato Heinz, MD On 08/21/2016.   Specialty:  Orthopedic Surgery Why:  at 9:45am Contact information: [address] [employer] [place] [address] [phone]            Signed: Tera Partridge 07/11/2016, 7:49 AM

## 2016-07-11 NOTE — Progress Notes (Signed)
Reviewed discharge summary with verbal understanding. Changed dressing per order, including two for take home. VSS at this time. Escorted to personal vehicle via wc by ortho staff.

## 2016-07-11 NOTE — Progress Notes (Signed)
Rx given to daughter in law [place].

## 2016-07-11 NOTE — Care Management Important Message (Signed)
Important Message  Patient Details  Name: Cindy Harper MRN: [id number] Date of Birth: [birth year]   Medicare Important Message Given:  Yes    [name], RN 07/11/2016, 9:02 AM

## 2016-07-11 NOTE — Progress Notes (Signed)
Physical Therapy Treatment Patient Details Name: Cindy Harper MRN: [id number] DOB: 10/22/1945 Today's Date: 07/11/2016    History of Present Illness Pt. is a 70 y.o.  female who was admitted who was admitted for a LTKR.     PT Comments    Pt is making good progress towards goals with ability to complete goals for discharge. Good progress with therex and per patient, had already walked nurses station prior to therapist arriving. Pt agreeable to perform further ambulation this date. HEP given and reviewed with pt and family. Pt continues to be motivated to perform therapy. Safe technique with bathroom activities. Improved ROM this date.  Follow Up Recommendations  Home health PT     Equipment Recommendations  None recommended by PT    Recommendations for Other Services       Precautions / Restrictions Precautions Precautions: Fall Restrictions Weight Bearing Restrictions: Yes LLE Weight Bearing: Weight bearing as tolerated    Mobility  Bed Mobility               General bed mobility comments: not performed as pt received in recliner  Transfers Overall transfer level: Modified independent Equipment used: Rolling walker (2 wheeled) Transfers: Sit to/from Stand Sit to Stand: Modified independent (Device/Increase time)         General transfer comment: safe technique performed with cues to push from seated surface. Once standing, pt stands with upright posture. RW used  Ambulation/Gait Ambulation/Gait assistance: Hydrographic surveyor (Feet): 300 Feet Assistive device: Rolling walker (2 wheeled) Gait Pattern/deviations: Step-through pattern     General Gait Details: ambulated using reciprocal gait pattern and safe technqiue. Cues given for upright posture. Improved symmetrical step length this date as well as improved speed.   Stairs Stairs: Yes Stairs assistance: Min guard Stair Management: One rail Right Number of Stairs: 4 General stair  comments: ambulated using step to gait pattern with safe technique. Slight buckling noted on first step, however cues given to contact quad and pt able to navigate further steps without difficulty. Safe technique performed using 1 rail.  Wheelchair Mobility    Modified Rankin (Stroke Patients Only)       Balance                                    Cognition Arousal/Alertness: Awake/alert Behavior During Therapy: WFL for tasks assessed/performed Overall Cognitive Status: Within Functional Limits for tasks assessed                      Exercises Total Joint Exercises Goniometric ROM: L knee AAROM: 0-90 degrees Other Exercises Other Exercises: Seated ther-ex performed including L LE ankle pumps, quad sets, SLRs, hip abd/add, SAQ, seated knee flexion stretches, and LAQ. All ther-ex performed x 12 reps and cga. Safe technique performed. Other Exercises: Pt ambulated to bathroom and observed pt sit on BSC. Safe technique with transfer on/off toilet as pt able to perform self hygiene.    General Comments        Pertinent Vitals/Pain Pain Assessment: Faces Faces Pain Scale: Hurts little more Pain Location: L knee Pain Descriptors / Indicators: Operative site guarding Pain Intervention(s): Limited activity within patient's tolerance;Premedicated before session;Ice applied    Home Living                      Prior Function  PT Goals (current goals can now be found in the care plan section) Acute Rehab PT Goals Patient Stated Goal: To return home. PT Goal Formulation: With patient Time For Goal Achievement: 07/24/16 Potential to Achieve Goals: Good Progress towards PT goals: Progressing toward goals    Frequency    BID      PT Plan Current plan remains appropriate    Co-evaluation             End of Session Equipment Utilized During Treatment: Gait belt Activity Tolerance: Patient tolerated treatment well Patient  left: in chair;with chair alarm set     Time: [id number] PT Time Calculation (min) (ACUTE ONLY): 38 min  Charges:  $Gait Training: 23-37 mins $Therapeutic Exercise: 8-22 mins                    G Codes:      Noele Icenhour 07/11/2016, 11:40 AM  Elizabeth PalauStephanie Simra Fiebig, PT, DPT [phone]

## 2017-01-27 DIAGNOSIS — E039 Hypothyroidism, unspecified: Secondary | ICD-10-CM | POA: Insufficient documentation

## 2017-07-07 ENCOUNTER — Other Ambulatory Visit: Payer: Self-pay | Admitting: Internal Medicine

## 2017-07-07 DIAGNOSIS — Z1231 Encounter for screening mammogram for malignant neoplasm of breast: Secondary | ICD-10-CM

## 2017-07-21 ENCOUNTER — Ambulatory Visit
Admission: RE | Admit: 2017-07-21 | Discharge: 2017-07-21 | Disposition: A | Discharge status: Home / Self Care | Payer: Medicare Other | Source: Ambulatory Visit | Attending: Internal Medicine | Admitting: Internal Medicine

## 2017-07-21 DIAGNOSIS — Z1231 Encounter for screening mammogram for malignant neoplasm of breast: Secondary | ICD-10-CM | POA: Insufficient documentation

## 2017-07-21 DIAGNOSIS — R928 Other abnormal and inconclusive findings on diagnostic imaging of breast: Secondary | ICD-10-CM | POA: Insufficient documentation

## 2017-07-24 ENCOUNTER — Other Ambulatory Visit: Payer: Self-pay | Admitting: Internal Medicine

## 2017-07-24 DIAGNOSIS — N6489 Other specified disorders of breast: Secondary | ICD-10-CM

## 2017-07-24 DIAGNOSIS — R928 Other abnormal and inconclusive findings on diagnostic imaging of breast: Secondary | ICD-10-CM

## 2017-07-30 ENCOUNTER — Ambulatory Visit
Admission: RE | Admit: 2017-07-30 | Discharge: 2017-07-30 | Disposition: A | Discharge status: Home / Self Care | Payer: Medicare Other | Source: Ambulatory Visit | Attending: Internal Medicine | Admitting: Internal Medicine

## 2017-07-30 DIAGNOSIS — N6489 Other specified disorders of breast: Secondary | ICD-10-CM | POA: Insufficient documentation

## 2017-07-30 DIAGNOSIS — R928 Other abnormal and inconclusive findings on diagnostic imaging of breast: Secondary | ICD-10-CM

## 2018-02-11 ENCOUNTER — Other Ambulatory Visit: Payer: Self-pay

## 2018-02-11 ENCOUNTER — Emergency Department
Admission: EM | Admit: 2018-02-11 | Discharge: 2018-02-11 | Disposition: A | Discharge status: Home / Self Care | Payer: Medicare Other | Attending: Emergency Medicine | Admitting: Emergency Medicine

## 2018-02-11 DIAGNOSIS — R55 Syncope and collapse: Secondary | ICD-10-CM | POA: Insufficient documentation

## 2018-02-11 DIAGNOSIS — Z7982 Long term (current) use of aspirin: Secondary | ICD-10-CM | POA: Insufficient documentation

## 2018-02-11 DIAGNOSIS — N390 Urinary tract infection, site not specified: Secondary | ICD-10-CM | POA: Diagnosis not present

## 2018-02-11 DIAGNOSIS — E039 Hypothyroidism, unspecified: Secondary | ICD-10-CM | POA: Insufficient documentation

## 2018-02-11 DIAGNOSIS — Z79899 Other long term (current) drug therapy: Secondary | ICD-10-CM | POA: Diagnosis not present

## 2018-02-11 LAB — COMPREHENSIVE METABOLIC PANEL
ALK PHOS: 111 U/L (ref 38–126)
ALT: 13 U/L — AB (ref 14–54)
AST: 16 U/L (ref 15–41)
Albumin: 4.2 g/dL (ref 3.5–5.0)
Anion gap: 8 (ref 5–15)
BUN: 18 mg/dL (ref 6–20)
CALCIUM: 8.9 mg/dL (ref 8.9–10.3)
CHLORIDE: 105 mmol/L (ref 101–111)
CO2: 24 mmol/L (ref 22–32)
Creatinine, Ser: 0.92 mg/dL (ref 0.44–1.00)
GFR calc non Af Amer: >60 mL/min (ref >60)
Glucose, Bld: 91 mg/dL (ref 65–99)
Potassium: 3.8 mmol/L (ref 3.5–5.1)
SODIUM: 137 mmol/L (ref 135–145)
Total Bilirubin: 0.9 mg/dL (ref 0.3–1.2)
Total Protein: 7.5 g/dL (ref 6.5–8.1)

## 2018-02-11 LAB — CBC WITH DIFFERENTIAL/PLATELET
BASOS PCT: 0 %
Basophils Absolute: 0 10*3/uL (ref 0–0.1)
EOS ABS: 0.1 10*3/uL (ref 0–0.7)
EOS PCT: 2 %
HCT: 41.2 % (ref 35.0–47.0)
HEMOGLOBIN: 14.2 g/dL (ref 12.0–16.0)
LYMPHS ABS: 1.3 10*3/uL (ref 1.0–3.6)
Lymphocytes Relative: 20 %
MCH: 30.9 pg (ref 26.0–34.0)
MCHC: 34.4 g/dL (ref 32.0–36.0)
MCV: 90 fL (ref 80.0–100.0)
MONO ABS: 0.5 10*3/uL (ref 0.2–0.9)
MONOS PCT: 8 %
NEUTROS PCT: 70 %
Neutro Abs: 4.4 10*3/uL (ref 1.4–6.5)
PLATELETS: 225 10*3/uL (ref 150–440)
RBC: 4.58 MIL/uL (ref 3.80–5.20)
RDW: 15 % — AB (ref 11.5–14.5)
WBC: 6.3 10*3/uL (ref 3.6–11.0)

## 2018-02-11 LAB — URINALYSIS, COMPLETE (UACMP) WITH MICROSCOPIC
BILIRUBIN URINE: NEGATIVE
GLUCOSE, UA: NEGATIVE mg/dL
KETONES UR: NEGATIVE mg/dL
LEUKOCYTES UA: NEGATIVE
NITRITE: POSITIVE — AB
PH: 6 (ref 5.0–8.0)
Protein, ur: NEGATIVE mg/dL
SPECIFIC GRAVITY, URINE: 1.02 (ref 1.005–1.030)

## 2018-02-11 LAB — TROPONIN I: TROPONIN I: 0.03 ng/mL — AB (ref <0.03)

## 2018-02-11 MED ORDER — SODIUM CHLORIDE 0.9 % IV SOLN
1.0000 g | Freq: Once | INTRAVENOUS | Status: AC
  Administered 2018-02-11: 1 g via INTRAVENOUS
  Filled 2018-02-11: qty 10

## 2018-02-11 MED ORDER — CEPHALEXIN 500 MG PO CAPS: 500.0000 mg | ORAL_CAPSULE | Freq: Two times a day (BID) | ORAL | 0 refills | Status: DC

## 2018-02-11 MED ORDER — CEPHALEXIN 500 MG PO CAPS: 500.0000 mg | ORAL_CAPSULE | Freq: Once | ORAL | Status: DC

## 2018-02-11 NOTE — ED Provider Notes (Signed)
[facility] Emergency Department Provider Note  Time seen: 10:03 AM  I have reviewed the triage vital signs and the nursing notes.   HISTORY  Chief Complaint Loss of Consciousness    HPI Cindy Harper is a 72 y.o. female with a past medical history of gastric reflux, presents to the emergency department after syncopal episode.  According to the patient 2 days ago she was sitting when she began feeling lightheaded and had a brief syncopal episode.  Patient states it is very brief she got up and she felt normal.  Denies any chest pain.  Patient states she had been having some diarrhea but denies any currently.  Patient states she had felt fine prior to that denies any chest pain or shortness of breath, nausea vomiting or fever.  Patient states since the event she is also felt fine.  However she was discussing this with family and they wanted her to come get evaluated.  Patient states she went to the walk-in clinic and they sent her to the emergency department for evaluation.  Currently the patient denies any symptoms, states she feels normal.   Past Medical History:  Diagnosis Date  . Dyspnea   . GERD (gastroesophageal reflux disease)   . Heart attack (HCC) 2005  . Hypothyroidism     Patient Active Problem List   Diagnosis Date Noted  . 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 ABDOMINAL HYSTERECTOMY    . CARDIAC CATHETERIZATION    . KNEE ARTHROPLASTY Left 07/09/2016   Procedure: COMPUTER ASSISTED TOTAL KNEE ARTHROPLASTY;  Surgeon: Donato Heinz, MD;  Location: [facility] ORS;  Service: Orthopedics;  Laterality: Left;    Prior to Admission medications   Medication Sig Start Date End Date Taking? Authorizing Provider  acetaminophen (TYLENOL) 500 MG tablet Take 500 mg by mouth every 6 (six) hours as needed for mild pain or headache.    [provider]  aspirin 81 MG chewable tablet Chew 81 mg by  mouth daily.    [provider]  Cyanocobalamin (VITAMIN B-12 PO) Take 1 tablet by mouth daily.    [provider]  enoxaparin (LOVENOX) 40 MG/0.4ML injection Inject 0.4 mLs (40 mg total) into the skin daily. 07/11/16   Tera Partridge, PA  etodolac (LODINE) 400 MG tablet Take 400 mg by mouth 2 (two) times daily.    [provider]  levothyroxine (SYNTHROID, LEVOTHROID) 100 MCG tablet Take 100 mcg by mouth daily before breakfast.    [provider]  omeprazole (PRILOSEC OTC) 20 MG tablet Take 20 mg by mouth daily.    [provider]  oxyCODONE (OXY IR/ROXICODONE) 5 MG immediate release tablet Take 1-2 tablets (5-10 mg total) by mouth every 4 (four) hours as needed for severe pain or breakthrough pain. 07/11/16   Tera Partridge, PA  traMADol (ULTRAM) 50 MG tablet Take 1-2 tablets (50-100 mg total) by mouth every 4 (four) hours as needed for moderate pain. 07/11/16   Tera Partridge, PA    No Known Allergies  Family History  Problem Relation Age of Onset  . Breast cancer Mother 58  . Breast cancer Maternal Aunt     Social History Social History   Tobacco Use  . Smoking status: Never Smoker  . Smokeless tobacco: Never Used  Substance Use Topics  . Alcohol use: No  . Drug use: No    Review of Systems Constitutional: Negative  for fever.  Positive for syncopal event 2 days ago. Eyes: Negative for visual complaints ENT: Negative for recent illness/congestion Cardiovascular: Negative for chest pain. Respiratory: Negative for shortness of breath. Gastrointestinal: Negative for abdominal pain, vomiting.  Intermittent diarrhea however this has since resolved. Genitourinary: Negative for urinary compaints Musculoskeletal: Negative for musculoskeletal complaints Skin: Negative for skin complaints  Neurological: Negative for headache All other ROS negative  ____________________________________________   PHYSICAL EXAM:  VITAL SIGNS: ED Triage  Vitals  Enc Vitals Group     BP 02/11/18 0913 132/74     Pulse Rate 02/11/18 0913 (!) 59     Resp --      Temp 02/11/18 0913 97.8 F (36.6 C)     Temp Source 02/11/18 0913 Oral     SpO2 02/11/18 0913 98 %     Weight 02/11/18 0908 205 lb (93 kg)     Height 02/11/18 0908  (1.676 m)     Head Circumference --      Peak Flow --      Pain Score 02/11/18 0908 0     Pain Loc --      Pain Edu? --      Excl. in GC? --    Constitutional: Alert and oriented. Well appearing and in no distress. Eyes: Normal exam ENT   Head: Normocephalic and atraumatic.   Mouth/Throat: Mucous membranes are moist. Cardiovascular: Normal rate, regular rhythm.  Respiratory: Normal respiratory effort without tachypnea nor retractions. Breath sounds are clear  Gastrointestinal: Soft and nontender. No distention. Musculoskeletal: Nontender with normal range of motion in all extremities. No lower extremity tenderness or edema. Neurologic:  Normal speech and language. No gross focal neurologic deficits  Skin:  Skin is warm, dry and intact.  Psychiatric: Mood and affect are normal.   ____________________________________________    EKG  EKG reviewed and interpreted by myself shows sinus bradycardia 58 bpm with a narrow QRS, left axis deviation, largely normal intervals.  Patient does have lateral T wave inversions, unchanged from prior.  ____________________________________________  INITIAL IMPRESSION / ASSESSMENT AND PLAN / ED COURSE  Pertinent labs & imaging results that were available during my care of the patient were reviewed by me and considered in my medical decision making (see chart for details).  Patient presents to the emergency department after syncopal episode 2 days ago.  Patient was sent from the walk-in clinic.  I am able to see the patient's urinalysis sample from the walk-in clinic which does appear to be consistent with urinary tract infection.  Overall the patient appears well,  reassuring vitals, reassuring exam.  Given her urinary tract infection we will dose Rocephin.  Send a urine culture.  Currently awaiting blood work including cardiac panel.  Patient's labs are largely within normal limits, nitrite positive urinalysis receiving Rocephin.  Troponin is 0.03.  I discussed with the patient follow-up with a cardiologist for further evaluation and possible Holter monitor.  I also discussed return precautions for any chest pain trouble breathing or any further syncopal events.  Patient agreeable to plan of care.  ____________________________________________   FINAL CLINICAL IMPRESSION(S) / ED DIAGNOSES  Syncope    Minna Antis, MD 02/11/18 1127

## 2018-02-11 NOTE — ED Triage Notes (Signed)
Pt states that she "passed out Tuesday" - she was sitting on a stool and got hot and "passed out" - denies headache - denies N/V - has felt fine since Tuesday after the episode - pt states that she just came to be "checked out"

## 2018-02-11 NOTE — Discharge Instructions (Addendum)
Please follow-up with cardiology by calling the number provided to discuss any further treatment or work-up.  Return to the emergency department for any further episodes of passing out or feeling like you are going to pass out, for any chest pain or trouble breathing.  Please take your antibiotics twice a day for the next 5 days.

## 2018-02-11 NOTE — ED Notes (Signed)
First Nurse Note:  Patient to ED via WC from [facility].  States she passed out on Tuesday.  Alert and oriented.  States she went to [facility] today and "he wants me to get checked out".

## 2018-02-11 NOTE — ED Notes (Signed)
Date and time results received: 02/11/18 1051  Test: troponin I Critical Value: 0.03  Name of Provider Notified: dr. Lenard Lance  Orders Received? Or Actions Taken?: no new orders at this time

## 2018-02-13 LAB — URINE CULTURE: Culture: >=100000 — AB

## 2018-04-01 DIAGNOSIS — I429 Cardiomyopathy, unspecified: Secondary | ICD-10-CM | POA: Insufficient documentation

## 2018-06-29 ENCOUNTER — Other Ambulatory Visit: Payer: Self-pay | Admitting: Internal Medicine

## 2018-06-29 DIAGNOSIS — Z1231 Encounter for screening mammogram for malignant neoplasm of breast: Secondary | ICD-10-CM

## 2018-08-19 ENCOUNTER — Ambulatory Visit
Admission: RE | Admit: 2018-08-19 | Discharge: 2018-08-19 | Disposition: A | Discharge status: Home / Self Care | Payer: Medicare Other | Source: Ambulatory Visit | Attending: Internal Medicine | Admitting: Internal Medicine

## 2018-08-19 DIAGNOSIS — Z1231 Encounter for screening mammogram for malignant neoplasm of breast: Secondary | ICD-10-CM | POA: Insufficient documentation

## 2019-06-14 ENCOUNTER — Ambulatory Visit: Admit: 2019-06-14 | Payer: PRIVATE HEALTH INSURANCE | Admitting: Internal Medicine

## 2019-06-14 SURGERY — COLONOSCOPY WITH PROPOFOL
Anesthesia: General

## 2020-10-09 ENCOUNTER — Other Ambulatory Visit: Payer: Self-pay | Admitting: Family Medicine

## 2020-10-09 ENCOUNTER — Other Ambulatory Visit: Payer: Self-pay | Admitting: Internal Medicine

## 2020-10-09 DIAGNOSIS — Z1231 Encounter for screening mammogram for malignant neoplasm of breast: Secondary | ICD-10-CM

## 2020-10-29 ENCOUNTER — Other Ambulatory Visit: Payer: Self-pay

## 2020-10-29 ENCOUNTER — Ambulatory Visit
Admission: RE | Admit: 2020-10-29 | Discharge: 2020-10-29 | Disposition: A | Discharge status: Home / Self Care | Payer: Medicare Other | Source: Ambulatory Visit | Attending: Internal Medicine | Admitting: Internal Medicine

## 2020-10-29 DIAGNOSIS — Z1231 Encounter for screening mammogram for malignant neoplasm of breast: Secondary | ICD-10-CM | POA: Diagnosis not present

## 2020-11-09 DIAGNOSIS — M1711 Unilateral primary osteoarthritis, right knee: Secondary | ICD-10-CM | POA: Insufficient documentation

## 2021-02-08 NOTE — Discharge Instructions (Signed)
Instructions after Total Knee Replacement   Akemi Overholser P. Chasady Longwell, Jr., M.D.     Dept. of Orthopaedics & Sports Medicine  [facility]  [address]  [place], [place]  [address]  Phone: [phone]   Fax: [phone]    DIET: Drink plenty of non-alcoholic fluids. Resume your normal diet. Include foods high in fiber.  ACTIVITY:  You may use crutches or a walker with weight-bearing as tolerated, unless instructed otherwise. You may be weaned off of the walker or crutches by your Physical Therapist.  Do NOT place pillows under the knee. Anything placed under the knee could limit your ability to straighten the knee.   Continue doing gentle exercises. Exercising will reduce the pain and swelling, increase motion, and prevent muscle weakness.   Please continue to use the TED compression stockings for 6 weeks. You may remove the stockings at night, but should reapply them in the morning. Do not drive or operate any equipment until instructed.  WOUND CARE:  Continue to use the PolarCare or ice packs periodically to reduce pain and swelling. You may bathe or shower after the staples are removed at the first office visit following surgery.  MEDICATIONS: You may resume your regular medications. Please take the pain medication as prescribed on the medication. Do not take pain medication on an empty stomach. You have been given a prescription for a blood thinner (Lovenox or Coumadin). Please take the medication as instructed. (NOTE: After completing a 2 week course of Lovenox, take one Enteric-coated aspirin once a day. This along with elevation will help reduce the possibility of phlebitis in your operated leg.) Do not drive or drink alcoholic beverages when taking pain medications.  CALL THE OFFICE FOR: Temperature above 101 degrees Excessive bleeding or drainage on the dressing. Excessive swelling, coldness, or paleness of the toes. Persistent nausea and vomiting.  FOLLOW-UP:  You  should have an appointment to return to the office in 10-14 days after surgery. Arrangements have been made for continuation of Physical Therapy (either home therapy or outpatient therapy).   [facility] Department Directory         [email or web address]       [email or web address]          Cardiology  Appointments: [place] - [phone] [place] - [phone]  Endocrinology  Appointments: [place] - [phone] [place] - [phone]  Gastroenterology  Appointments: [place] - [phone] [place] - [phone]        General Surgery   Appointments: [place] - [phone]  Internal Medicine/Family Medicine  Appointments: [place] - [phone] [place] - [phone] [place] - [phone]  Metabolic and Weigh Loss Surgery  Appointments: [place] - [phone]        Neurology  Appointments: [place] - [phone] [place] - [phone]  Neurosurgery  Appointments: [place] - [phone]  Obstetrics & Gynecology  Appointments: [place] - [phone] [place] - [phone]        Pediatrics  Appointments: [place] - [phone] [place] - [phone]  Physiatry  Appointments: [place] -[phone]  Physical Therapy  Appointments: [place] - [phone] [place] - [phone]        Podiatry  Appointments: [place] - [phone] [place] - [phone]  Pulmonology  Appointments: [place] - [phone]  Rheumatology  Appointments: [place] - [phone]        [place] Location: [facility]  [address] [place], [place]  [address]  [place] Location: [facility] [address] [place], [place]  [address]  [place] Location: [facility] [address] [place], [place]  [address]    

## 2021-02-18 DIAGNOSIS — E538 Deficiency of other specified B group vitamins: Secondary | ICD-10-CM | POA: Insufficient documentation

## 2021-02-22 ENCOUNTER — Other Ambulatory Visit: Payer: Self-pay

## 2021-02-22 ENCOUNTER — Other Ambulatory Visit
Admission: RE | Admit: 2021-02-22 | Discharge: 2021-02-22 | Disposition: A | Discharge status: Home / Self Care | Payer: Medicare Other | Source: Ambulatory Visit | Attending: Orthopedic Surgery | Admitting: Orthopedic Surgery

## 2021-02-22 ENCOUNTER — Encounter: Payer: Self-pay | Admitting: Orthopedic Surgery

## 2021-02-22 DIAGNOSIS — Z01812 Encounter for preprocedural laboratory examination: Secondary | ICD-10-CM | POA: Diagnosis present

## 2021-02-22 HISTORY — DX: Unspecified osteoarthritis, unspecified site: M19.90

## 2021-02-22 LAB — CBC
HCT: 39 % (ref 36.0–46.0)
Hemoglobin: 13 g/dL (ref 12.0–15.0)
MCH: 30.8 pg (ref 26.0–34.0)
MCHC: 33.3 g/dL (ref 30.0–36.0)
MCV: 92.4 fL (ref 80.0–100.0)
Platelets: 256 10*3/uL (ref 150–400)
RBC: 4.22 MIL/uL (ref 3.87–5.11)
RDW: 13.8 % (ref 11.5–15.5)
WBC: 6.7 10*3/uL (ref 4.0–10.5)
nRBC: 0 % (ref 0.0–0.2)

## 2021-02-22 LAB — COMPREHENSIVE METABOLIC PANEL
ALT: 15 U/L (ref 0–44)
AST: 18 U/L (ref 15–41)
Albumin: 4.4 g/dL (ref 3.5–5.0)
Alkaline Phosphatase: 83 U/L (ref 38–126)
Anion gap: 9 (ref 5–15)
BUN: 19 mg/dL (ref 8–23)
CO2: 24 mmol/L (ref 22–32)
Calcium: 9.2 mg/dL (ref 8.9–10.3)
Chloride: 105 mmol/L (ref 98–111)
Creatinine, Ser: 0.97 mg/dL (ref 0.44–1.00)
GFR, Estimated: >60 mL/min (ref >60)
Glucose, Bld: 89 mg/dL (ref 70–99)
Potassium: 4.1 mmol/L (ref 3.5–5.1)
Sodium: 138 mmol/L (ref 135–145)
Total Bilirubin: 1.1 mg/dL (ref 0.3–1.2)
Total Protein: 6.9 g/dL (ref 6.5–8.1)

## 2021-02-22 LAB — TYPE AND SCREEN
ABO/RH(D): A NEG
Antibody Screen: NEGATIVE

## 2021-02-22 LAB — SEDIMENTATION RATE: Sed Rate: 7 mm/hr (ref 0–30)

## 2021-02-22 LAB — APTT: aPTT: 28 seconds (ref 24–36)

## 2021-02-22 LAB — C-REACTIVE PROTEIN: CRP: 0.5 mg/dL (ref <1.0)

## 2021-02-22 LAB — PROTIME-INR
INR: 1.1 (ref 0.8–1.2)
Prothrombin Time: 14 seconds (ref 11.4–15.2)

## 2021-02-22 LAB — SURGICAL PCR SCREEN
MRSA, PCR: NEGATIVE
Staphylococcus aureus: NEGATIVE

## 2021-02-22 NOTE — Patient Instructions (Signed)
Your procedure is scheduled on: Monday March 04, 2021. Report to Day Surgery inside [facility] 2nd floor (stop by admissions desk first before getting on elevator). To find out your arrival time please call [phone] between 1PM - 3PM on Friday March 01, 2021.  Remember: Instructions that are not followed completely may result in serious medical risk,  up to and including death, or upon the discretion of your surgeon and anesthesiologist your  surgery may need to be rescheduled.     _X__ 1. Do not eat food after midnight the night before your procedure.                 No chewing gum or hard candies. You may drink clear liquids up to 2 hours                 before you are scheduled to arrive for your surgery- DO not drink clear                 liquids within 2 hours of the start of your surgery.                 Clear Liquids include:  water, apple juice without pulp, clear Gatorade, G2 or                  Gatorade Zero (avoid Red/Purple/Blue), Black Coffee or Tea (Do not add                 anything to coffee or tea).  __X__2.   Complete the "Ensure Clear Pre-surgery Clear Carbohydrate Drink" provided to you, 2 hours before arrival. **If you are diabetic you will be provided with an alternative drink, Gatorade Zero or G2.  __X__3.  On the morning of surgery brush your teeth with toothpaste and water, you                may rinse your mouth with mouthwash if you wish.  Do not swallow any toothpaste of mouthwash.     _X__ 4.  No Alcohol for 24 hours before or after surgery.   _X__ 5.  Do Not Smoke or use e-cigarettes For 24 Hours Prior to Your Surgery.                 Do not use any chewable tobacco products for at least 6 hours prior to                 Surgery.  _X__  6.  Do not use any recreational drugs (marijuana, cocaine, heroin, ecstasy, MDMA or other)                For at least one week prior to your surgery.  Combination of these drugs with anesthesia                 May have life threatening results.  __X_  7.  Notify your doctor if there is any change in your medical condition      (cold, fever, infections).     Do not wear jewelry, make-up, hairpins, clips or nail polish. Do not wear lotions, powders, or perfumes. You may wear deodorant. Do not shave 48 hours prior to surgery. Men may shave face and neck. Do not bring valuables to the hospital.    [facility] is not responsible for any belongings or valuables.  Contacts, dentures or bridgework may not be worn into surgery. Leave your suitcase in the car. After  surgery it may be brought to your room. For patients admitted to the hospital, discharge time is determined by your treatment team.   Patients discharged the day of surgery will not be allowed to drive home.   Make arrangements for someone to be with you for the first 24 hours of your Same Day Discharge.    Please read over the following fact sheets that you were given:   Total Joint Packet    __X__ Take these medicines the morning of surgery with A SIP OF WATER:    1. levothyroxine (SYNTHROID, LEVOTHROID) 100 MCG  2.   3.   4.  5.  6.  ____ Fleet Enema (as directed)   __X__ Use CHG Soap (or wipes) as directed  ____ Use Benzoyl Peroxide Gel as instructed  ____ Use inhalers on the day of surgery  ____ Stop metformin 2 days prior to surgery    ____ Take 1/2 of usual insulin dose the night before surgery. No insulin the morning          of surgery.   ____ Call your PCP, cardiologist, or Pulmonologist if taking Coumadin/Plavix/aspirin and ask when to stop before your surgery.   __X__ One Week prior to surgery- Stop Anti-inflammatories such as Ibuprofen, Aleve, Advil, Motrin, meloxicam (MOBIC), diclofenac, etodolac, ketorolac, Toradol, Daypro, piroxicam, Goody's or BC powders. OK TO USE TYLENOL IF NEEDED   __X__ Stop supplements until after surgery.    ____ Bring C-Pap to the hospital.    If you have any  questions regarding your pre-procedure instructions,  Please call Pre-admit Testing at [phone].

## 2021-02-25 ENCOUNTER — Encounter: Payer: Self-pay | Admitting: Orthopedic Surgery

## 2021-02-25 NOTE — Progress Notes (Signed)
Perioperative Services  Pre-Admission/Anesthesia Testing Clinical Review  Date: 03/01/21  Patient Demographics:  Name: Cindy Harper DOB:   1945/09/20 MRN:   [id number]  Planned Surgical Procedure(s):    Case: [id number] Date/Time: 03/04/21 0700   Procedure: COMPUTER ASSISTED TOTAL KNEE ARTHROPLASTY (Right: Knee)   Anesthesia type: Choice   Pre-op diagnosis: PRIMARY OSTEOARTHRITIS OF RIGHT KNEE.   Location: [facility] OR ROOM 01 / [facility] ORS FOR ANESTHESIA GROUP   Surgeons: Donato Heinz, MD     NOTE: Available PAT nursing documentation and vital signs have been reviewed. Clinical nursing staff has updated patient's PMH/PSHx, current medication list, and drug allergies/intolerances to ensure comprehensive history available to assist in medical decision making as it pertains to the aforementioned surgical procedure and anticipated anesthetic course. Extensive review of available clinical information performed. [place] PMH and PSHx updated with any diagnoses/procedures that  may have been inadvertently omitted during her intake with the pre-admission testing department's nursing staff.  Clinical Discussion:  Cindy Harper is a 75 y.o. female who is submitted for pre-surgical anesthesia review and clearance prior to her undergoing the above procedure. Patient has never been a smoker. Pertinent PMH includes: CAD, MI, dilated cardiomyopathy, hypothyroidism, dyspnea, GERD (no daily Tx), OA.   Patient is followed by cardiology Lady Gary, MD). She was last seen in the cardiology clinic on 11/28/2020; notes reviewed.  At the time of her clinic visit, patient doing "fairly well" from a cardiovascular perspective.  She denied any chest pain, PND, orthopnea, palpitations, peripheral edema, vertiginous symptoms, or presyncope/syncope.  Patient did report some shortness of breath.  Past medical history significant for cardiovascular diagnoses.  Patient suffered a non-Q wave myocardial infarction and 2005.   Diagnostic left heart catheterization at that time revealed noncritical/nonocclusive CAD and the decision was made to treat patient medically.  Of note, records regarding cardiac catheterization unavailable for review.  Information obtained from primary cardiologist's clinical note.  TTE performed on 03/09/2018 revealed moderate left ventricular systolic dysfunction with an EF of 35%.  There was distal septal dyskinesis note, in addition to mild mitral, tricuspid, and pulmonary valve regurgitation.  Myocardial perfusion imaging study performed on 04/13/2018 revealed inferior wall hypokinesis and a low normal left ventricular systolic function (LVEF 50%).  There was evidence of a moderate perfusion abnormality of severe intensity consistent with scar without significant reversible ischemia (see full interpretation of cardiovascular testing below).  Blood pressure well controlled at 124/82.  Patient not currently on any lipid-lowering therapies.  ASCVD risk reduction being approached with diet and lifestyle modification. Cardiologist noted the patient remaining fairly active, however she has no defined exercise regimen. Functional capacity, as defined by DASI, is documented as being >/= 4 METS.  No changes were made to patient's medication regimen.  Patient to follow-up with outpatient cardiology in 12 months or sooner if needed.  Patient is scheduled for an elective total knee arthroplasty on 03/04/2021 with Dr. Francesco Sor.  Given patient's past medical history significant for cardiovascular diagnoses, presurgical cardiac clearance was sought by the PAT team.   Per cardiology, "this patient is optimized for surgery and may proceed with the planned procedural course with an ACCEPTABLE risk stratification". This patient is on daily antiplatelet therapy. She has been instructed on recommendations for holding her daily low dose ASA for 7 days prior to her procedure with plans to restart as soon as  postoperative bleeding risk felt to be minimized by her attending surgeon. The patient has been instructed that her last  dose of her anticoagulant will be on 02/25/2021.  Clearance for procedure was also issued by internal medicine Hyacinth Meeker, MD) placing patient at an overall LOW risk for the planned procedure.   Patient denies previous perioperative complications with anesthesia in the past. In review of the available records, it is noted that patient underwent a neuraxial anesthetic course here (ASA III) in 06/2016 without documented complications.   Vitals with BMI 02/22/2021 02/11/2018 02/11/2018  Height 5\' 6"  - -  Weight 197 lbs 13 oz - -  BMI 31.94 - -  Systolic 130 115 -  Diastolic 72 85 -  Pulse 68 57 57    Providers/Specialists:   NOTE: Primary physician provider listed below. Patient may have been seen by APP or partner within same practice.   PROVIDER ROLE / SPECIALTY LAST OV  Hooten, , MD  Orthopedics (Surgeon) 10/17/2020  12/15/2020, MD  Primary Care Provider 02/18/2021  04/20/2021, MD  Cardiology 11/28/2020   Allergies:  Ezetimibe-simvastatin  Current Home Medications:   No current facility-administered medications for this encounter.    acetaminophen (TYLENOL) 500 MG tablet   aspirin 81 MG chewable tablet   Cyanocobalamin (B-12) 3000 MCG CAPS   ibuprofen (ADVIL) 200 MG tablet   levothyroxine (SYNTHROID, LEVOTHROID) 100 MCG tablet   Multiple Vitamin (MULTIVITAMIN WITH MINERALS) TABS tablet   nitrofurantoin, macrocrystal-monohydrate, (MACROBID) 100 MG capsule   sulfamethoxazole-trimethoprim (BACTRIM DS) 800-160 MG tablet   History:   Past Medical History:  Diagnosis Date   Arthritis    B12 deficiency    CAD (coronary artery disease)    DCM (dilated cardiomyopathy) (HCC)    Dyspnea    GERD (gastroesophageal reflux disease)    HLD (hyperlipidemia)    Hypothyroidism    Non Q wave myocardial infarction (HCC) 2005   Past Surgical History:   Procedure Laterality Date   ABDOMINAL HYSTERECTOMY     CARDIAC CATHETERIZATION N/A 2005   KNEE ARTHROPLASTY Left 07/09/2016   Procedure: COMPUTER ASSISTED TOTAL KNEE ARTHROPLASTY;  Surgeon: 07/11/2016, MD;  Location: [facility] ORS;  Service: Orthopedics;  Laterality: Left;   Family History  Problem Relation Age of Onset   Breast cancer Mother 41   Breast cancer Maternal Aunt    Social History   Tobacco Use   Smoking status: Never   Smokeless tobacco: Never  Vaping Use   Vaping Use: Never used  Substance Use Topics   Alcohol use: No   Drug use: No    Pertinent Clinical Results:  LABS: Labs reviewed: Acceptable for surgery.  Hospital Outpatient Visit on 02/28/2021  Component Date Value Ref Range Status   SARS Coronavirus 2 02/28/2021 NEGATIVE  NEGATIVE Final   Comment: (NOTE) SARS-CoV-2 target nucleic acids are NOT DETECTED.  The SARS-CoV-2 RNA is generally detectable in upper and lower respiratory specimens during the acute phase of infection. Negative results do not preclude SARS-CoV-2 infection, do not rule out co-infections with other pathogens, and should not be used as the sole basis for treatment or other patient management decisions. Negative results must be combined with clinical observations, patient history, and epidemiological information. The expected result is Negative.  Fact Sheet for Patients: 03/02/2021  Fact Sheet for Healthcare Providers: HairSlick.no  This test is not yet approved or cleared by the quierodirigir.com FDA and  has been authorized for detection and/or diagnosis of SARS-CoV-2 by FDA under an Emergency Use Authorization (EUA). This EUA will remain  in effect (meaning this test can be used)  for the duration of the COVID-19 declaration under Se                          ction 564(b)(1) of the Act, 21 U.S.C. section 360bbb-3(b)(1), unless the authorization is terminated or revoked  sooner.  Performed at [facility], [address] [place], [place] [address]    Color, Urine 02/28/2021 YELLOW (A) YELLOW Final   APPearance 02/28/2021 HAZY (A) CLEAR Final   Specific Gravity, Urine 02/28/2021 1.016  1.005 - 1.030 Final   pH 02/28/2021 6.0  5.0 - 8.0 Final   Glucose, UA 02/28/2021 NEGATIVE  NEGATIVE mg/dL Final   Hgb urine dipstick 02/28/2021 NEGATIVE  NEGATIVE Final   Bilirubin Urine 02/28/2021 NEGATIVE  NEGATIVE Final   Ketones, ur 02/28/2021 NEGATIVE  NEGATIVE mg/dL Final   Protein, ur 52/84/132406/16/2022 NEGATIVE  NEGATIVE mg/dL Final   Nitrite 40/10/272506/16/2022 POSITIVE (A) NEGATIVE Final   Leukocytes,Ua 02/28/2021 NEGATIVE  NEGATIVE Final   RBC / HPF 02/28/2021 0-5  0 - 5 RBC/hpf Final   WBC, UA 02/28/2021 0-5  0 - 5 WBC/hpf Final   Bacteria, UA 02/28/2021 MANY (A) NONE SEEN Final   Squamous Epithelial / LPF 02/28/2021 NONE SEEN  0 - 5 Final   Mucus 02/28/2021 PRESENT   Final   Performed at [facility], [address] [place], [place] [address]   Specimen Description 02/28/2021    Final                   Value:URINE, CLEAN CATCH Performed at [facility], [address] [place], [place] [address]    Special Requests 02/28/2021    Final                   Value:NONE Performed at [facility], [address] [place], [place] [address]    Culture 02/28/2021  (A)  Final                   Value:>=100,000 COLONIES/mL ESCHERICHIA COLI SUSCEPTIBILITIES TO FOLLOW Performed at [facility], [address] [place], [place] [address]    Report Status 02/28/2021 PENDING   Incomplete    ECG: Date: 02/18/2021 Time ECG obtained: 1053 AM Rate: 53 bpm Rhythm: sinus bradycardia Axis (leads I and aVF): Normal Intervals: PR 156 ms. QRS 76 ms. QTc 422 ms. ST segment and T wave changes: No evidence of acute ST segment elevation or depression Comparison: Similar to previous tracing obtained on 04/07/2019   IMAGING /  PROCEDURES: DIAGNOSTIC RADIOGRAPHS RIGHT KNEE 3 VIEWS performed on 11/06/2020 Narrowing of the medial cartilage space with associated varus alignment Osteophyte formation is noted Subchondral sclerosis is noted No evidence of fracture or dislocation  LEXISCAN performed on 04/13/2018 LVEF 50% Hypokinesis of the inferior wall Left ventricular cavity size normal No artifacts noted Moderate perfusion abnormality of severe intensity present in the inferior region on stress images The overall quality of study is good  TRANSTHORACIC ECHOCARDIOGRAM performed on 03/09/2018 LVEF 35% Moderate left ventricular systolic dysfunction with focal distal septal dyskinesis Normal right ventricular systolic function Mild MR and TR Trivial PR No AR No evidence of valvular stenosis No evidence of pericardial effusion  Impression and Plan:  Jill AlexandersNancy P Drohan has been referred for pre-anesthesia review and clearance prior to her undergoing the planned anesthetic and procedural courses. Available labs, pertinent testing, and imaging results were personally reviewed by me. This patient has  been appropriately cleared by cardiology with an overall ACCEPTABLE risk of significant perioperative cardiovascular complications.  At the time of her initial PAT visit on 02/22/2021, patient was on nitrofurantoin for a UTI as prescribed by her PCP.  Plans from PCP were to repeat UA following antimicrobial course.  Patient returned to PAT clinic on 02/28/2021 at which time UA and urine C&S were repeated.  Results from the testing indicated that her previously diagnosed UTI had not cleared. Patient started on 3 day course of SMZ-TMP DS and advised to increase fluid intake. Primary surgeon made aware of results and Tx plan of care.   Based on clinical review performed today (03/01/21), barring any significant acute changes in the patient's overall condition, it is anticipated that she will be able to proceed with the planned  surgical intervention. Any acute changes in clinical condition may necessitate her procedure being postponed and/or cancelled. Patient will meet with anesthesia team (MD and/or CRNA) on the day of her procedure for preoperative evaluation/assessment. Questions regarding anesthetic course will be fielded at that time.   Pre-surgical instructions were reviewed with the patient during her PAT appointment and questions were fielded by PAT clinical staff. Patient was advised that if any questions or concerns arise prior to her procedure then she should return a call to PAT and/or her surgeon's office to discuss.  Quentin Mulling, MSN, APRN, FNP-C, CEN [facility]  Peri-operative Services Nurse Practitioner Phone: [phone] 03/01/21 2:19 PM  NOTE: This note has been prepared using Dragon dictation software. Despite my best ability to proofread, there is always the potential that unintentional transcriptional errors may still occur from this process.

## 2021-02-28 ENCOUNTER — Other Ambulatory Visit: Payer: Self-pay

## 2021-02-28 ENCOUNTER — Other Ambulatory Visit
Admission: RE | Admit: 2021-02-28 | Discharge: 2021-02-28 | Disposition: A | Discharge status: Home / Self Care | Payer: Medicare Other | Source: Ambulatory Visit | Attending: Orthopedic Surgery | Admitting: Orthopedic Surgery

## 2021-02-28 DIAGNOSIS — Z01812 Encounter for preprocedural laboratory examination: Secondary | ICD-10-CM | POA: Diagnosis present

## 2021-02-28 DIAGNOSIS — Z20822 Contact with and (suspected) exposure to covid-19: Secondary | ICD-10-CM | POA: Insufficient documentation

## 2021-02-28 LAB — URINALYSIS, ROUTINE W REFLEX MICROSCOPIC
Bilirubin Urine: NEGATIVE
Glucose, UA: NEGATIVE mg/dL
Hgb urine dipstick: NEGATIVE
Ketones, ur: NEGATIVE mg/dL
Leukocytes,Ua: NEGATIVE
Nitrite: POSITIVE — AB
Protein, ur: NEGATIVE mg/dL
Specific Gravity, Urine: 1.016 (ref 1.005–1.030)
Squamous Epithelial / HPF: NONE SEEN (ref 0–5)
pH: 6 (ref 5.0–8.0)

## 2021-03-01 ENCOUNTER — Telehealth: Payer: Self-pay | Admitting: Urgent Care

## 2021-03-01 LAB — SARS CORONAVIRUS 2 (TAT 6-24 HRS): SARS Coronavirus 2: NEGATIVE

## 2021-03-01 MED ORDER — SULFAMETHOXAZOLE-TRIMETHOPRIM 800-160 MG PO TABS: 1.0000 | ORAL_TABLET | Freq: Two times a day (BID) | ORAL | 0 refills | Status: DC

## 2021-03-01 NOTE — Progress Notes (Signed)
  [facility] Perioperative Services: Pre-Admission/Anesthesia Testing  Abnormal Lab Notification and Treatment Plan of Care   Date: 03/01/21  Name: Cindy Harper MRN:   [id number]  Re: Abnormal labs noted during PAT appointment   Provider(s) Notified: Francesco Sor, MD Notification mode: Routed and/or faxed via [facility]   ABNORMAL LAB VALUE(S): Lab Results  Component Value Date   COLORURINE YELLOW (A) 02/28/2021   APPEARANCEUR HAZY (A) 02/28/2021   LABSPEC 1.016 02/28/2021   PHURINE 6.0 02/28/2021   GLUCOSEU NEGATIVE 02/28/2021   HGBUR NEGATIVE 02/28/2021   BILIRUBINUR NEGATIVE 02/28/2021   KETONESUR NEGATIVE 02/28/2021   PROTEINUR NEGATIVE 02/28/2021   NITRITE POSITIVE (A) 02/28/2021   LEUKOCYTESUR NEGATIVE 02/28/2021   EPIU NONE SEEN 02/28/2021   WBCU 0-5 02/28/2021   RBCU 0-5 02/28/2021   BACTERIA MANY (A) 02/28/2021   Lab Results  Component Value Date   CULT (A) 02/28/2021    >=100,000 COLONIES/mL ESCHERICHIA COLI SUSCEPTIBILITIES TO FOLLOW Performed at [facility], [address] [place], [place] [address]     Notes:   Patient scheduled for RIGHT total knee arthroplasty on 03/04/2021 with Dr. Francesco Sor.  Patient recently treated by PCP for UTI with a 5-day course of nitrofurantoin.  Repeat culture on 02/25/2021 and PCPs office revealed resolution of infection; culture demonstrated no growth.  However, with that being said, UA performed in PAT on 02/28/2021 consistent with infection.  No leukocytosis noted on CBC; 6700 Renal function normal. Estimated Creatinine Clearance: 55.7 mL/min (by C-G formula based on SCr of 0.97 mg/dL). Urine C&S added to assess for pathogenically significant growth.  Culture grew out >100,000 CFU Escherichia coli  Impression and Plan:  UA was (+) for infection; reflex culture sent. Contacted patient to discuss. Patient reporting that she is experiencing minor LUTS. Patient with surgery scheduled on Monday  (03/04/2021). In efforts to avoid delaying patient's procedure, I would like to proceed with empiric treatment for urinary tract infection.  Will treat with a 3 day course of SMZ-TMP DS. Patient encouraged to complete the entire course of antibiotics even if she begins to feel better. She was advised that if culture demonstrates resistance to the prescribed antibiotic, she will be contacted over the weekend and advised of the need to change the antibiotic being used to treat her infection.   Meds ordered this encounter  Medications   sulfamethoxazole-trimethoprim (BACTRIM DS) 800-160 MG tablet    Sig: Take 1 tablet by mouth 2 (two) times daily for 3 days. Increase WATER intake.    Dispense:  6 tablet    Refill:  0   Patient encouraged to increase her fluid intake as much as possible. Discussed that water is always best to flush the urinary tract. She was advised to avoid caffeine containing fluids until her infections clears, as caffeine can cause her to experience painful bladder spasms.   May use Tylenol as needed for pain/fever.   Results and treatment plan of care forwarded to primary attending surgeon to make them aware.   This is a Personal assistant; no formal response is required.  Quentin Mulling, MSN, APRN, FNP-C, CEN [facility]  Peri-operative Services Nurse Practitioner Phone: [phone] Fax: [phone] 03/01/21 2:15 PM

## 2021-03-02 ENCOUNTER — Telehealth: Payer: Self-pay | Admitting: Urgent Care

## 2021-03-02 DIAGNOSIS — T3695XA Adverse effect of unspecified systemic antibiotic, initial encounter: Secondary | ICD-10-CM

## 2021-03-02 DIAGNOSIS — R110 Nausea: Secondary | ICD-10-CM

## 2021-03-02 DIAGNOSIS — N390 Urinary tract infection, site not specified: Secondary | ICD-10-CM

## 2021-03-02 LAB — URINE CULTURE: Culture: >=100000 — AB

## 2021-03-02 MED ORDER — ONDANSETRON 4 MG PO TBDP: 4.0000 mg | ORAL_TABLET | Freq: Three times a day (TID) | ORAL | 0 refills | Status: DC | PRN

## 2021-03-02 NOTE — Progress Notes (Signed)
  Perioperative Services Pre-Admission/Anesthesia Testing    Date: 03/02/21  Name: Cindy Harper MRN:   [id number]  Re: Request for intervention  Received communication from patient's daughter-in-law Cindy Harper) to report that patient is experiencing nausea associated with the ABX prescribed for patient's UTI. This is a relatively short course of therapy (3 days) that is being implemented pre-operatively; has TKA scheduled early Monday morning (03/04/2021). Ideally, I would like for patient to complete the prescribed course of therapy in efforts to sterilize her urine preoperatively in order to prevent potential case delay and post-operative complications.   Impression and Plan:  Patient with nausea related to antimicrobial therapy. This is a common side effect associated with the use of this medication. Family member encouraged to make sure that patient is eating something when she is taking the ABX. Will send in a PRN supply of ondansetron ODT tablets for patient to use to her with her symptoms. Patient to continue therapy and increase fluid intake in efforts to clear culture proven urinary pathogen prior to her surgery on Monday. 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 I am confident in her ability to evaluate the effectiveness of the aforementioned plan. If not effective, and symptoms persist, she was encouraged to follow up with me for further directives.   Meds ordered this encounter  Medications   ondansetron (ZOFRAN-ODT) 4 MG disintegrating tablet    Sig: Take 1 tablet (4 mg total) by mouth every 8 (eight) hours as needed for nausea or vomiting.    Dispense:  10 tablet    Refill:  0   Quentin Mulling, MSN, APRN, FNP-C, CEN [facility]  Peri-operative Services Nurse Practitioner Phone: [phone] 03/02/21 9:41 PM  NOTE: This note has been prepared using Dragon dictation software. Despite my best ability to proofread, there is always the potential that unintentional transcriptional errors  may still occur from this process.

## 2021-03-03 NOTE — H&P (Signed)
ORTHOPAEDIC HISTORY & PHYSICAL Michelene Gardener, [place] - 02/26/2021 3:45 PM EDT Formatting of this note is different from the original. [facility] Chief Complaint:   Chief Complaint  Patient presents with   Knee Pain  H & P RIGHT KNEE   History of Present Illness:   Cindy Harper is a 75 y.o. female that presents to clinic today for her preoperative history and evaluation. Patient presents unaccompanied. The patient is scheduled to undergo a right total knee arthroplasty on 03/04/21 by Dr. Ernest Pine. Her pain began 3 years ago. The pain is located along the medial aspect of the knee. She describes her pain as a constant aching with intermittent sharp pains. Pain is worse with weightbearing as well as rising after sitting. She reports associated swelling. She denies associated numbness or tingling, denies locking or giving way of the knee.   The patient's symptoms have progressed to the point that they decrease her quality of life. The patient has previously undergone conservative treatment including NSAIDS and injections to the knee without adequate control of her symptoms.  Patient has a history of MI in 2005 and was last seen by Dr. Lady Gary on 11/28/20. Denies history of blood clot, lumbar surgery. No significant drug allergies. She will be staying with her son and daughter-in-law after surgery.   Past Medical, Surgical, Family, Social History, Allergies, Medications:   Past Medical History:  Past Medical History:  Diagnosis Date   Coronary artery disease   GERD (gastroesophageal reflux disease)   Hyperlipidemia   Osteoarthritis   Thyroid disease   Past Surgical History:  Past Surgical History:  Procedure Laterality Date   ABDOMINAL HYSTERECTOMY 1980  with one ovary remaining   CARDIAC CATHETERIZATION  50-60% ostial 1st diagonal with no significant large vessel disease.   COLONOSCOPY 04/21/2013  Indication: Screening. Findings: Two 2-5  mm benign (hyperplastic and lymphoid aggregate) polyps in the ascending colon. Sigmoid and descending diverticulosis. Small internal hemorrhoids.   HYSTERECTOMY   LEFT TOTAL KNEE ARTHROPLASTY USING COMPUTER-ASSISTED NAVIGATION Left 07/09/2016   TONSILLECTOMY AND ADENOIDECTOMY   Current Medications:  Current Outpatient Medications  Medication Sig Dispense Refill   acetaminophen (TYLENOL) 500 MG tablet Take 1,000 mg by mouth every 6 (six) hours as needed   aspirin 81 MG EC tablet Take 81 mg by mouth once daily.   cyanocobalamin, vitamin B-12, 3,000 mcg Cap Take 1 capsule by mouth every other day   ibuprofen (MOTRIN) 200 MG tablet Take 200 mg by mouth every 6 (six) hours as needed   levothyroxine (SYNTHROID) 100 MCG tablet TAKE 1 TAB BY MOUTH ON AN EMPTY STOMACH WITH GLASS OF WATER AT LEAST 30-60 MINUTES BEFORE BREAKFAST. 90 tablet 3   multivit with minerals/lutein (MULTIVITAMIN 50 PLUS ORAL) Take 1 tablet by mouth once daily   No current facility-administered medications for this visit.   Allergies:  Allergies  Allergen Reactions   Ezetimibe-Simvastatin Muscle Pain  Muscle Pain   Social History:  Social History   Socioeconomic History   Marital status: Widowed   Number of children: 1   Years of education: 12   Highest education level: High school graduate  Occupational History   Occupation: Investment banker, corporate  Tobacco Use   Smoking status: Never Smoker   Smokeless tobacco: Never Used  Building services engineer Use: Never used  Substance and Sexual Activity   Alcohol use: Not Currently  Alcohol/week: 0.0 standard drinks   Drug use: Never  Sexual activity: Not Currently  Partners: Male   Family History:  Family History  Problem Relation Age of Onset   Breast cancer Mother   Coronary Artery Disease (Blocked arteries around heart) Father   Other Brother  motor vechile accident   Review of Systems:   A 10+ ROS was performed, reviewed, and the pertinent orthopaedic findings  are documented in the HPI.   Physical Examination:   BP (!) 130/90 (BP Location: Left upper arm, Patient Position: Sitting, BP Cuff Size: Adult)  Ht 167.6 cm (5\' 6" )  Wt 90.2 kg (198 lb 12.8 oz)  BMI 32.09 kg/m   Patient is a well-developed, well-nourished female in no acute distress. Patient has normal mood and affect. Patient is alert and oriented to person, place, and time.   HEENT: Atraumatic, normocephalic. Pupils equal and reactive to light. Extraocular motion intact. Noninjected sclera.  Cardiovascular: Regular rate and rhythm, with no murmurs, rubs, or gallops. Distal pulses palpable.  Respiratory: Lungs clear to auscultation bilaterally.     Right Knee: Soft tissue swelling: mild Effusion: minimal Erythema: none Crepitance: mild Tenderness: medial Alignment: relative varus Mediolateral laxity: medial pseudolaxity Posterior sag: negative Patellar tracking: Good tracking without evidence of subluxation or tilt Atrophy: No significant atrophy.  Quadriceps tone was fair to good. Range of motion: 0/6/107 degrees  Sensation intact over the saphenous, lateral sural cutaneous, superficial fibular, and deep fibular nerve distributions.  Tests Performed/Reviewed:  X-rays  X-ray knee right 3 views  Result Date: 02/26/2021 3 views of the right knee were obtained. Images reveal severe loss of medial compartment joint space with significant osteophyte formation. No fractures or dislocations. No osseous abnormality noted.   Impression:   ICD-10-CM  1. Primary osteoarthritis of right knee M17.11   Plan:   The patient has end-stage degenerative changes of the right knee. It was explained to the patient that the condition is progressive in nature. Having failed conservative treatment, the patient has elected to proceed with a total joint arthroplasty. The patient will undergo a total joint arthroplasty with Dr. 02/28/2021. The risks of surgery, including blood clot and infection,  were discussed with the patient. Measures to reduce these risks, including the use of anticoagulation, perioperative antibiotics, and early ambulation were discussed. The importance of postoperative physical therapy was discussed with the patient. The patient elects to proceed with surgery. The patient is instructed to stop all blood thinners prior to surgery. The patient is instructed to call the hospital the day before surgery to learn of the proper arrival time.   Contact our office with any questions or concerns. Follow up as indicated, or sooner should any new problems arise, if conditions worsen, or if they are otherwise concerned.   Ernest Pine, PA -C [facility] [address] [place], [place] Phone: [phone]  This note was generated in part with voice recognition software and I apologize for any typographical errors that were not detected and corrected.  Electronically signed by [phone], PA at 03/03/2021 12:44 PM EDT

## 2021-03-04 ENCOUNTER — Other Ambulatory Visit: Payer: Self-pay

## 2021-03-04 ENCOUNTER — Inpatient Hospital Stay: Payer: Medicare Other

## 2021-03-04 ENCOUNTER — Inpatient Hospital Stay: Payer: Medicare Other | Admitting: Urgent Care

## 2021-03-04 ENCOUNTER — Encounter
Admission: RE | Disposition: A | Discharge status: Home Health | Payer: Self-pay | Source: Home / Self Care | Attending: Orthopedic Surgery

## 2021-03-04 ENCOUNTER — Inpatient Hospital Stay
Admission: RE | Admit: 2021-03-04 | Discharge: 2021-03-06 | DRG: 470 | Disposition: A | Discharge status: Home Health | Payer: Medicare Other | Attending: Orthopedic Surgery | Admitting: Orthopedic Surgery

## 2021-03-04 ENCOUNTER — Encounter: Payer: Self-pay | Admitting: Orthopedic Surgery

## 2021-03-04 DIAGNOSIS — E039 Hypothyroidism, unspecified: Secondary | ICD-10-CM | POA: Diagnosis present

## 2021-03-04 DIAGNOSIS — Z20822 Contact with and (suspected) exposure to covid-19: Secondary | ICD-10-CM | POA: Diagnosis present

## 2021-03-04 DIAGNOSIS — Z96652 Presence of left artificial knee joint: Secondary | ICD-10-CM | POA: Diagnosis present

## 2021-03-04 DIAGNOSIS — Z7982 Long term (current) use of aspirin: Secondary | ICD-10-CM | POA: Diagnosis not present

## 2021-03-04 DIAGNOSIS — E785 Hyperlipidemia, unspecified: Secondary | ICD-10-CM | POA: Diagnosis present

## 2021-03-04 DIAGNOSIS — I2510 Atherosclerotic heart disease of native coronary artery without angina pectoris: Secondary | ICD-10-CM | POA: Diagnosis present

## 2021-03-04 DIAGNOSIS — Z90710 Acquired absence of both cervix and uterus: Secondary | ICD-10-CM | POA: Diagnosis not present

## 2021-03-04 DIAGNOSIS — I252 Old myocardial infarction: Secondary | ICD-10-CM

## 2021-03-04 DIAGNOSIS — I420 Dilated cardiomyopathy: Secondary | ICD-10-CM | POA: Diagnosis present

## 2021-03-04 DIAGNOSIS — Z79890 Hormone replacement therapy (postmenopausal): Secondary | ICD-10-CM | POA: Diagnosis not present

## 2021-03-04 DIAGNOSIS — Z96659 Presence of unspecified artificial knee joint: Secondary | ICD-10-CM

## 2021-03-04 DIAGNOSIS — M1711 Unilateral primary osteoarthritis, right knee: Principal | ICD-10-CM | POA: Diagnosis present

## 2021-03-04 DIAGNOSIS — K219 Gastro-esophageal reflux disease without esophagitis: Secondary | ICD-10-CM | POA: Diagnosis present

## 2021-03-04 DIAGNOSIS — E538 Deficiency of other specified B group vitamins: Secondary | ICD-10-CM | POA: Diagnosis present

## 2021-03-04 DIAGNOSIS — Z79899 Other long term (current) drug therapy: Secondary | ICD-10-CM

## 2021-03-04 HISTORY — DX: Dilated cardiomyopathy: I42.0

## 2021-03-04 HISTORY — DX: Hyperlipidemia, unspecified: E78.5

## 2021-03-04 HISTORY — DX: Deficiency of other specified B group vitamins: E53.8

## 2021-03-04 HISTORY — PX: KNEE ARTHROPLASTY: SHX992

## 2021-03-04 HISTORY — DX: Atherosclerotic heart disease of native coronary artery without angina pectoris: I25.10

## 2021-03-04 SURGERY — ARTHROPLASTY, KNEE, TOTAL, USING IMAGELESS COMPUTER-ASSISTED NAVIGATION
Anesthesia: Spinal | Site: Knee | Laterality: Right

## 2021-03-04 MED ORDER — NEOMYCIN-POLYMYXIN B GU 40-200000 IR SOLN
Status: DC | PRN
  Administered 2021-03-04: 14 mL

## 2021-03-04 MED ORDER — TRANEXAMIC ACID-NACL 1000-0.7 MG/100ML-% IV SOLN
INTRAVENOUS | Status: AC
  Filled 2021-03-04: qty 100

## 2021-03-04 MED ORDER — METOCLOPRAMIDE HCL 10 MG PO TABS
10.0000 mg | ORAL_TABLET | Freq: Three times a day (TID) | ORAL | Status: AC
  Administered 2021-03-04 – 2021-03-06 (×8): 10 mg via ORAL
  Filled 2021-03-04 (×8): qty 1

## 2021-03-04 MED ORDER — LACTATED RINGERS IV SOLN: INTRAVENOUS | Status: DC

## 2021-03-04 MED ORDER — LIDOCAINE HCL (PF) 2 % IJ SOLN
INTRAMUSCULAR | Status: AC
  Filled 2021-03-04: qty 5

## 2021-03-04 MED ORDER — BUPIVACAINE HCL (PF) 0.5 % IJ SOLN
INTRAMUSCULAR | Status: DC | PRN
  Administered 2021-03-04: 3 mL

## 2021-03-04 MED ORDER — LEVOTHYROXINE SODIUM 100 MCG PO TABS
100.0000 ug | ORAL_TABLET | Freq: Every day | ORAL | Status: DC
  Administered 2021-03-05 – 2021-03-06 (×2): 100 ug via ORAL
  Filled 2021-03-04: qty 2
  Filled 2021-03-04 (×2): qty 1
  Filled 2021-03-04: qty 2

## 2021-03-04 MED ORDER — ONDANSETRON HCL 4 MG/2ML IJ SOLN: 4.0000 mg | Freq: Four times a day (QID) | INTRAMUSCULAR | Status: DC | PRN

## 2021-03-04 MED ORDER — CHLORHEXIDINE GLUCONATE 0.12 % MT SOLN: 15.0000 mL | Freq: Once | OROMUCOSAL | Status: AC

## 2021-03-04 MED ORDER — PROPOFOL 1000 MG/100ML IV EMUL
INTRAVENOUS | Status: AC
  Filled 2021-03-04: qty 100

## 2021-03-04 MED ORDER — SODIUM CHLORIDE 0.9 % IV SOLN: INTRAVENOUS | Status: DC

## 2021-03-04 MED ORDER — FERROUS SULFATE 325 (65 FE) MG PO TABS
325.0000 mg | ORAL_TABLET | Freq: Two times a day (BID) | ORAL | Status: DC
  Administered 2021-03-04 – 2021-03-06 (×4): 325 mg via ORAL
  Filled 2021-03-04 (×4): qty 1

## 2021-03-04 MED ORDER — FAMOTIDINE 20 MG PO TABS
ORAL_TABLET | ORAL | Status: AC
  Administered 2021-03-04: 20 mg via ORAL
  Filled 2021-03-04: qty 1

## 2021-03-04 MED ORDER — TRANEXAMIC ACID-NACL 1000-0.7 MG/100ML-% IV SOLN: 1000.0000 mg | Freq: Once | INTRAVENOUS | Status: AC

## 2021-03-04 MED ORDER — CELECOXIB 200 MG PO CAPS: 400.0000 mg | ORAL_CAPSULE | Freq: Once | ORAL | Status: AC

## 2021-03-04 MED ORDER — PROPOFOL 500 MG/50ML IV EMUL
INTRAVENOUS | Status: DC | PRN
  Administered 2021-03-04: 200 ug/kg/min via INTRAVENOUS

## 2021-03-04 MED ORDER — CEFAZOLIN SODIUM-DEXTROSE 2-4 GM/100ML-% IV SOLN
2.0000 g | Freq: Four times a day (QID) | INTRAVENOUS | Status: AC
  Administered 2021-03-04 (×2): 2 g via INTRAVENOUS
  Filled 2021-03-04 (×2): qty 100

## 2021-03-04 MED ORDER — ACETAMINOPHEN 10 MG/ML IV SOLN
INTRAVENOUS | Status: DC | PRN
  Administered 2021-03-04: 1000 mg via INTRAVENOUS

## 2021-03-04 MED ORDER — ENOXAPARIN SODIUM 30 MG/0.3ML IJ SOSY
30.0000 mg | PREFILLED_SYRINGE | Freq: Two times a day (BID) | INTRAMUSCULAR | Status: DC
  Administered 2021-03-05 – 2021-03-06 (×3): 30 mg via SUBCUTANEOUS
  Filled 2021-03-04 (×3): qty 0.3

## 2021-03-04 MED ORDER — ACETAMINOPHEN 10 MG/ML IV SOLN
1000.0000 mg | Freq: Four times a day (QID) | INTRAVENOUS | Status: AC
  Administered 2021-03-04 – 2021-03-05 (×4): 1000 mg via INTRAVENOUS
  Filled 2021-03-04 (×4): qty 100

## 2021-03-04 MED ORDER — PANTOPRAZOLE SODIUM 40 MG PO TBEC
40.0000 mg | DELAYED_RELEASE_TABLET | Freq: Two times a day (BID) | ORAL | Status: DC
  Administered 2021-03-04 – 2021-03-06 (×4): 40 mg via ORAL
  Filled 2021-03-04 (×4): qty 1

## 2021-03-04 MED ORDER — TRANEXAMIC ACID-NACL 1000-0.7 MG/100ML-% IV SOLN
1000.0000 mg | INTRAVENOUS | Status: AC
  Administered 2021-03-04: 1000 mg via INTRAVENOUS

## 2021-03-04 MED ORDER — VITAMIN B-12 1000 MCG PO TABS
3000.0000 ug | ORAL_TABLET | ORAL | Status: DC
  Administered 2021-03-05: 3000 ug via ORAL
  Filled 2021-03-04 (×2): qty 3

## 2021-03-04 MED ORDER — GABAPENTIN 300 MG PO CAPS
ORAL_CAPSULE | ORAL | Status: AC
  Administered 2021-03-04: 300 mg via ORAL
  Filled 2021-03-04: qty 1

## 2021-03-04 MED ORDER — TRAMADOL HCL 50 MG PO TABS
50.0000 mg | ORAL_TABLET | ORAL | Status: DC | PRN
  Administered 2021-03-04: 50 mg via ORAL
  Administered 2021-03-05: 100 mg via ORAL
  Administered 2021-03-05: 50 mg via ORAL
  Filled 2021-03-04 (×2): qty 1
  Filled 2021-03-04: qty 2
  Filled 2021-03-04: qty 1

## 2021-03-04 MED ORDER — SODIUM CHLORIDE 0.9 % IV SOLN
INTRAVENOUS | Status: DC | PRN
  Administered 2021-03-04: 60 mL

## 2021-03-04 MED ORDER — CEFAZOLIN SODIUM-DEXTROSE 2-4 GM/100ML-% IV SOLN
2.0000 g | INTRAVENOUS | Status: AC
  Administered 2021-03-04: 2 g via INTRAVENOUS

## 2021-03-04 MED ORDER — BUPIVACAINE HCL (PF) 0.25 % IJ SOLN
INTRAMUSCULAR | Status: DC | PRN
  Administered 2021-03-04: 60 mL

## 2021-03-04 MED ORDER — DEXAMETHASONE SODIUM PHOSPHATE 10 MG/ML IJ SOLN
INTRAMUSCULAR | Status: AC
  Administered 2021-03-04: 8 mg via INTRAVENOUS
  Filled 2021-03-04: qty 1

## 2021-03-04 MED ORDER — MENTHOL 3 MG MT LOZG
1.0000 | LOZENGE | OROMUCOSAL | Status: DC | PRN
  Filled 2021-03-04: qty 9

## 2021-03-04 MED ORDER — SENNOSIDES-DOCUSATE SODIUM 8.6-50 MG PO TABS
1.0000 | ORAL_TABLET | Freq: Two times a day (BID) | ORAL | Status: DC
  Administered 2021-03-04 – 2021-03-06 (×4): 1 via ORAL
  Filled 2021-03-04 (×4): qty 1

## 2021-03-04 MED ORDER — LIDOCAINE HCL (CARDIAC) PF 100 MG/5ML IV SOSY
PREFILLED_SYRINGE | INTRAVENOUS | Status: DC | PRN
  Administered 2021-03-04: 60 mg via INTRAVENOUS

## 2021-03-04 MED ORDER — MAGNESIUM HYDROXIDE 400 MG/5ML PO SUSP
30.0000 mL | Freq: Every day | ORAL | Status: DC
  Administered 2021-03-05 – 2021-03-06 (×2): 30 mL via ORAL
  Filled 2021-03-04 (×2): qty 30

## 2021-03-04 MED ORDER — CHLORHEXIDINE GLUCONATE 0.12 % MT SOLN
OROMUCOSAL | Status: AC
  Administered 2021-03-04: 15 mL via OROMUCOSAL
  Filled 2021-03-04: qty 15

## 2021-03-04 MED ORDER — GABAPENTIN 300 MG PO CAPS: 300.0000 mg | ORAL_CAPSULE | Freq: Once | ORAL | Status: AC

## 2021-03-04 MED ORDER — ORAL CARE MOUTH RINSE: 15.0000 mL | Freq: Once | OROMUCOSAL | Status: AC

## 2021-03-04 MED ORDER — ONDANSETRON HCL 4 MG PO TABS: 4.0000 mg | ORAL_TABLET | Freq: Four times a day (QID) | ORAL | Status: DC | PRN

## 2021-03-04 MED ORDER — ENSURE PRE-SURGERY PO LIQD
296.0000 mL | Freq: Once | ORAL | Status: AC
  Administered 2021-03-04: 296 mL via ORAL
  Filled 2021-03-04: qty 296

## 2021-03-04 MED ORDER — CHLORHEXIDINE GLUCONATE 4 % EX LIQD
60.0000 mL | Freq: Once | CUTANEOUS | Status: AC
  Administered 2021-03-04: 4 via TOPICAL

## 2021-03-04 MED ORDER — DEXMEDETOMIDINE HCL IN NACL 200 MCG/50ML IV SOLN
INTRAVENOUS | Status: DC | PRN
  Administered 2021-03-04: 8 ug via INTRAVENOUS

## 2021-03-04 MED ORDER — CELECOXIB 200 MG PO CAPS
ORAL_CAPSULE | ORAL | Status: AC
  Administered 2021-03-04: 400 mg via ORAL
  Filled 2021-03-04: qty 2

## 2021-03-04 MED ORDER — TRANEXAMIC ACID-NACL 1000-0.7 MG/100ML-% IV SOLN
INTRAVENOUS | Status: AC
  Administered 2021-03-04: 1000 mg via INTRAVENOUS
  Filled 2021-03-04: qty 100

## 2021-03-04 MED ORDER — BISACODYL 10 MG RE SUPP
10.0000 mg | Freq: Every day | RECTAL | Status: DC | PRN
  Administered 2021-03-05: 10 mg via RECTAL
  Filled 2021-03-04 (×2): qty 1

## 2021-03-04 MED ORDER — MIDAZOLAM HCL 2 MG/2ML IJ SOLN
INTRAMUSCULAR | Status: AC
  Filled 2021-03-04: qty 2

## 2021-03-04 MED ORDER — DEXAMETHASONE SODIUM PHOSPHATE 10 MG/ML IJ SOLN: 8.0000 mg | Freq: Once | INTRAMUSCULAR | Status: AC

## 2021-03-04 MED ORDER — GLYCOPYRROLATE 0.2 MG/ML IJ SOLN
INTRAMUSCULAR | Status: DC | PRN
  Administered 2021-03-04 (×2): .2 mg via INTRAVENOUS

## 2021-03-04 MED ORDER — ACETAMINOPHEN 325 MG PO TABS: 325.0000 mg | ORAL_TABLET | Freq: Four times a day (QID) | ORAL | Status: DC | PRN

## 2021-03-04 MED ORDER — MIDAZOLAM HCL 5 MG/5ML IJ SOLN
INTRAMUSCULAR | Status: DC | PRN
  Administered 2021-03-04 (×2): 1 mg via INTRAVENOUS

## 2021-03-04 MED ORDER — CELECOXIB 200 MG PO CAPS
200.0000 mg | ORAL_CAPSULE | Freq: Two times a day (BID) | ORAL | Status: DC
  Administered 2021-03-04 – 2021-03-06 (×4): 200 mg via ORAL
  Filled 2021-03-04 (×4): qty 1

## 2021-03-04 MED ORDER — FLEET ENEMA 7-19 GM/118ML RE ENEM: 1.0000 | ENEMA | Freq: Once | RECTAL | Status: DC | PRN

## 2021-03-04 MED ORDER — ADULT MULTIVITAMIN W/MINERALS CH
1.0000 | ORAL_TABLET | Freq: Every day | ORAL | Status: DC
  Administered 2021-03-04 – 2021-03-06 (×3): 1 via ORAL
  Filled 2021-03-04 (×2): qty 1

## 2021-03-04 MED ORDER — OXYCODONE HCL 5 MG PO TABS: 10.0000 mg | ORAL_TABLET | ORAL | Status: DC | PRN

## 2021-03-04 MED ORDER — SURGIPHOR WOUND IRRIGATION SYSTEM - OPTIME
TOPICAL | Status: DC | PRN
  Administered 2021-03-04: 1 via TOPICAL

## 2021-03-04 MED ORDER — DIPHENHYDRAMINE HCL 12.5 MG/5ML PO ELIX
12.5000 mg | ORAL_SOLUTION | ORAL | Status: DC | PRN
  Filled 2021-03-04: qty 10

## 2021-03-04 MED ORDER — CEFAZOLIN SODIUM-DEXTROSE 2-4 GM/100ML-% IV SOLN
INTRAVENOUS | Status: AC
  Filled 2021-03-04: qty 100

## 2021-03-04 MED ORDER — ONDANSETRON HCL 4 MG/2ML IJ SOLN
INTRAMUSCULAR | Status: DC | PRN
  Administered 2021-03-04: 4 mg via INTRAVENOUS

## 2021-03-04 MED ORDER — PHENOL 1.4 % MT LIQD
1.0000 | OROMUCOSAL | Status: DC | PRN
  Filled 2021-03-04: qty 177

## 2021-03-04 MED ORDER — ALUM & MAG HYDROXIDE-SIMETH 200-200-20 MG/5ML PO SUSP: 30.0000 mL | ORAL | Status: DC | PRN

## 2021-03-04 MED ORDER — HYDROMORPHONE HCL 1 MG/ML IJ SOLN: 0.5000 mg | INTRAMUSCULAR | Status: DC | PRN

## 2021-03-04 MED ORDER — OXYCODONE HCL 5 MG PO TABS
5.0000 mg | ORAL_TABLET | ORAL | Status: DC | PRN
  Administered 2021-03-06: 5 mg via ORAL
  Filled 2021-03-04: qty 1

## 2021-03-04 MED ORDER — SODIUM CHLORIDE 0.9 % IV SOLN
INTRAVENOUS | Status: DC | PRN
  Administered 2021-03-04: 30 ug/min via INTRAVENOUS

## 2021-03-04 MED ORDER — FAMOTIDINE 20 MG PO TABS: 20.0000 mg | ORAL_TABLET | Freq: Once | ORAL | Status: AC

## 2021-03-04 SURGICAL SUPPLY — 76 items
ATTUNE MED DOME PAT 38 KNEE (Knees) ×1 IMPLANT
ATTUNE MED DOME PAT 38MM KNEE (Knees) ×1 IMPLANT
ATTUNE PS FEM RT SZ 6 CEM KNEE (Femur) ×2 IMPLANT
ATTUNE PSRP INSR SZ6 10 KNEE (Insert) ×1 IMPLANT
ATTUNE PSRP INSR SZ6 10MM KNEE (Insert) ×1 IMPLANT
BASE TIBIA ATTUNE KNEE SYS SZ6 (Knees) IMPLANT
BATTERY INSTRU NAVIGATION (MISCELLANEOUS) ×12 IMPLANT
BLADE SAW 70X12.5 (BLADE) ×3 IMPLANT
BLADE SAW 90X13X1.19 OSCILLAT (BLADE) ×3 IMPLANT
BLADE SAW 90X25X1.19 OSCILLAT (BLADE) ×3 IMPLANT
BSPLAT TIB 6 CMNT ROT PLAT STR (Knees) ×1 IMPLANT
BTRY SRG DRVR LF (MISCELLANEOUS) ×4
CEMENT HV SMART SET (Cement) ×4 IMPLANT
COOLER POLAR GLACIER W/PUMP (MISCELLANEOUS) ×3 IMPLANT
COVER WAND RF STERILE (DRAPES) ×3 IMPLANT
CUFF TOURN SGL QUICK 24 (TOURNIQUET CUFF) ×3
CUFF TOURN SGL QUICK 34 (TOURNIQUET CUFF)
CUFF TRNQT CYL 24X4X16.5-23 (TOURNIQUET CUFF) IMPLANT
CUFF TRNQT CYL 34X4.125X (TOURNIQUET CUFF) IMPLANT
DRAPE 3/4 80X56 (DRAPES) ×3 IMPLANT
DRSG DERMACEA 8X12 NADH (GAUZE/BANDAGES/DRESSINGS) ×3 IMPLANT
DRSG MEPILEX SACRM 8.7X9.8 (GAUZE/BANDAGES/DRESSINGS) ×3 IMPLANT
DRSG OPSITE POSTOP 4X14 (GAUZE/BANDAGES/DRESSINGS) ×3 IMPLANT
DRSG TEGADERM 4X4.75 (GAUZE/BANDAGES/DRESSINGS) ×3 IMPLANT
DURAPREP 26ML APPLICATOR (WOUND CARE) ×6 IMPLANT
ELECT CAUTERY BLADE 6.4 (BLADE) ×3 IMPLANT
ELECT REM PT RETURN 9FT ADLT (ELECTROSURGICAL) ×3
ELECTRODE REM PT RTRN 9FT ADLT (ELECTROSURGICAL) ×1 IMPLANT
EX-PIN ORTHOLOCK NAV 4X150 (PIN) ×6 IMPLANT
GLOVE SURG ENC MOIS LTX SZ7.5 (GLOVE) ×6 IMPLANT
GLOVE SURG ENC TEXT LTX SZ7.5 (GLOVE) ×6 IMPLANT
GLOVE SURG UNDER LTX SZ8 (GLOVE) ×3 IMPLANT
GLOVE SURG UNDER POLY LF SZ7.5 (GLOVE) ×3 IMPLANT
GOWN STRL REUS W/ TWL LRG LVL3 (GOWN DISPOSABLE) ×2 IMPLANT
GOWN STRL REUS W/ TWL XL LVL3 (GOWN DISPOSABLE) ×1 IMPLANT
GOWN STRL REUS W/TWL LRG LVL3 (GOWN DISPOSABLE) ×6
GOWN STRL REUS W/TWL XL LVL3 (GOWN DISPOSABLE) ×3
HEMOVAC 400CC 10FR (MISCELLANEOUS) ×3 IMPLANT
HOLDER FOLEY CATH W/STRAP (MISCELLANEOUS) ×3 IMPLANT
HOOD PEEL AWAY FLYTE STAYCOOL (MISCELLANEOUS) ×6 IMPLANT
IRRIGATION SURGIPHOR STRL (IV SOLUTION) ×3 IMPLANT
IV NS IRRIG 3000ML ARTHROMATIC (IV SOLUTION) ×3 IMPLANT
KIT TURNOVER KIT A (KITS) ×3 IMPLANT
KNIFE SCULPS 14X20 (INSTRUMENTS) ×3 IMPLANT
LABEL OR SOLS (LABEL) ×3 IMPLANT
MANIFOLD NEPTUNE II (INSTRUMENTS) ×6 IMPLANT
NDL SAFETY ECLIPSE 18X1.5 (NEEDLE) ×1 IMPLANT
NDL SPNL 20GX3.5 QUINCKE YW (NEEDLE) ×2 IMPLANT
NEEDLE HYPO 18GX1.5 SHARP (NEEDLE) ×3
NEEDLE SPNL 20GX3.5 QUINCKE YW (NEEDLE) ×6 IMPLANT
NS IRRIG 500ML POUR BTL (IV SOLUTION) ×3 IMPLANT
PACK TOTAL KNEE (MISCELLANEOUS) ×3 IMPLANT
PAD WRAPON POLAR KNEE (MISCELLANEOUS) ×1 IMPLANT
PENCIL SMOKE EVACUATOR COATED (MISCELLANEOUS) ×3 IMPLANT
PIN DRILL QUICK PACK ×3 IMPLANT
PIN FIXATION 1/8DIA X 3INL (PIN) ×9 IMPLANT
PULSAVAC PLUS IRRIG FAN TIP (DISPOSABLE) ×3
SOL PREP PVP 2OZ (MISCELLANEOUS) ×3
SOLUTION PREP PVP 2OZ (MISCELLANEOUS) ×1 IMPLANT
SPONGE DRAIN TRACH 4X4 STRL 2S (GAUZE/BANDAGES/DRESSINGS) ×3 IMPLANT
STAPLER SKIN PROX 35W (STAPLE) ×3 IMPLANT
STOCKINETTE IMPERV 14X48 (MISCELLANEOUS) ×2 IMPLANT
STRAP TIBIA SHORT (MISCELLANEOUS) ×3 IMPLANT
SUCTION FRAZIER HANDLE 10FR (MISCELLANEOUS) ×2
SUCTION TUBE FRAZIER 10FR DISP (MISCELLANEOUS) ×1 IMPLANT
SUT VIC AB 0 CT1 36 (SUTURE) ×6 IMPLANT
SUT VIC AB 1 CT1 36 (SUTURE) ×6 IMPLANT
SUT VIC AB 2-0 CT2 27 (SUTURE) ×3 IMPLANT
SYR 20ML LL LF (SYRINGE) ×3 IMPLANT
SYR 30ML LL (SYRINGE) ×6 IMPLANT
TIBIA ATTUNE KNEE SYS BASE SZ6 (Knees) ×3 IMPLANT
TIP FAN IRRIG PULSAVAC PLUS (DISPOSABLE) ×1 IMPLANT
TOWEL OR 17X26 4PK STRL BLUE (TOWEL DISPOSABLE) ×3 IMPLANT
TOWER CARTRIDGE SMART MIX (DISPOSABLE) ×3 IMPLANT
TRAY FOLEY MTR SLVR 16FR STAT (SET/KITS/TRAYS/PACK) ×3 IMPLANT
WRAPON POLAR PAD KNEE (MISCELLANEOUS) ×3

## 2021-03-04 NOTE — Evaluation (Signed)
Physical Therapy Evaluation Patient Details Name: Cindy Harper MRN: [id number] DOB: 1946-09-08 Today's Date: 03/04/2021   History of Present Illness  75yo female s/p R TKA on 03/04/21. PMHx includes CAD, past MI (2005), thyroid disease, GERd, HLD, OA, L TKA.  Clinical Impression  Patient alert, agreeable to PT, reported 3/10 pain in R knee. Patient reported at baseline she is independent, lives alone. Per pt, at discharge she plans to stay with her son Genevie Cheshire.   The patient was able to perform several supine exercises with verbal cueing. Pt with good SLR on RLE. Supine to sit with minA for RLE, pt able to sit EOB with fair balance. Sit <> Stand with RW and modA, due to decreased weight bearing on RLE per pt preference. Once up, able to take several steps to recliner in room with RW and CGA.  Overall the patient demonstrated deficits (see "PT Problem List") that impede the patient's functional abilities, safety, and mobility and would benefit from skilled PT intervention. Recommendation is HHPT with supervision/assistance 24/7.       Follow Up Recommendations Home health PT;Supervision/Assistance - 24 hour    Equipment Recommendations  3in1 (PT)    Recommendations for Other Services       Precautions / Restrictions Precautions Precautions: Fall Restrictions Weight Bearing Restrictions: No      Mobility  Bed Mobility Overal bed mobility: Needs Assistance Bed Mobility: Supine to Sit     Supine to sit: Min guard;HOB elevated     General bed mobility comments: minA for RLE    Transfers Overall transfer level: Needs assistance Equipment used: Rolling walker (2 wheeled) Transfers: Sit to/from Stand Sit to Stand: Mod assist            Ambulation/Gait Ambulation/Gait assistance: Min guard Gait Distance (Feet): 2 Feet Assistive device: Rolling walker (2 wheeled)          Stairs            Wheelchair Mobility    Modified Rankin (Stroke Patients Only)        Balance Overall balance assessment: Needs assistance Sitting-balance support: Feet supported Sitting balance-Leahy Scale: Fair       Standing balance-Leahy Scale: Poor Standing balance comment: reliant on UE support                             Pertinent Vitals/Pain Pain Assessment: 0-10 Pain Score: 3  Pain Location: R knee Pain Descriptors / Indicators: Guarding;Grimacing;Sore Pain Intervention(s): Limited activity within patient's tolerance;Monitored during session;Repositioned;Premedicated before session;Ice applied;RN gave pain meds during session    Home Living Family/patient expects to be discharged to:: Private residence Living Arrangements: Children Available Help at Discharge: Family Type of Home: House Home Access: Ramped entrance     Home Layout: One level Home Equipment: Environmental consultant - 2 wheels Additional Comments: pt plans to discharging to her sons house    Prior Function Level of Independence: Independent               Hand Dominance        Extremity/Trunk Assessment   Upper Extremity Assessment Upper Extremity Assessment: Defer to OT evaluation    Lower Extremity Assessment Lower Extremity Assessment: RLE deficits/detail;LLE deficits/detail RLE Deficits / Details: R TKA LLE Deficits / Details: WFLs       Communication   Communication: No difficulties  Cognition Arousal/Alertness: Awake/alert Behavior During Therapy: WFL for tasks assessed/performed Overall Cognitive Status: Within Functional Limits for  tasks assessed                                        General Comments      Exercises Total Joint Exercises Ankle Circles/Pumps: AROM;Both;10 reps Short Arc Quad: AROM;Strengthening;Right;10 reps Heel Slides: Strengthening;Right;AROM;10 reps Straight Leg Raises: AROM;Right;Strengthening (1) Long Arc Quad: AROM;Strengthening;Right;10 reps Knee Flexion: AROM;Strengthening;Right;10 reps Goniometric ROM:  0 - 73 degrees   Assessment/Plan    PT Assessment Patient needs continued PT services  PT Problem List Decreased strength;Decreased mobility;Decreased range of motion;Decreased activity tolerance;Decreased balance;Decreased knowledge of use of DME;Pain;Decreased safety awareness       PT Treatment Interventions DME instruction;Balance training;Therapeutic exercise;Gait training;Stair training;Neuromuscular re-education;Therapeutic activities;Patient/family education;Functional mobility training    PT Goals (Current goals can be found in the Care Plan section)  Acute Rehab PT Goals Patient Stated Goal: to go home PT Goal Formulation: With patient Time For Goal Achievement: 03/18/21 Potential to Achieve Goals: Good    Frequency BID   Barriers to discharge        Co-evaluation               AM-PAC PT "6 Clicks" Mobility  Outcome Measure Help needed turning from your back to your side while in a flat bed without using bedrails?: A Little Help needed moving from lying on your back to sitting on the side of a flat bed without using bedrails?: A Little Help needed moving to and from a bed to a chair (including a wheelchair)?: A Little Help needed standing up from a chair using your arms (e.g., wheelchair or bedside chair)?: A Little Help needed to walk in hospital room?: A Little Help needed climbing 3-5 steps with a railing? : A Little 6 Click Score: 18    End of Session Equipment Utilized During Treatment: Gait belt Activity Tolerance: Patient tolerated treatment well Patient left: in chair;with chair alarm set;with call bell/phone within reach Nurse Communication: Mobility status PT Visit Diagnosis: Other abnormalities of gait and mobility (R26.89);Muscle weakness (generalized) (M62.81);Difficulty in walking, not elsewhere classified (R26.2);Pain Pain - Right/Left: Right Pain - part of body: Knee    Time: 3818-2993 PT Time Calculation (min) (ACUTE ONLY): 41  min   Charges:   PT Evaluation $PT Eval Low Complexity: 1 Low PT Treatments $Therapeutic Exercise: 23-37 mins        Olga Coaster PT, DPT 4:32 PM,03/04/21

## 2021-03-04 NOTE — Anesthesia Procedure Notes (Signed)
Spinal  Patient location during procedure: OR Start time: 03/04/2021 7:15 AM End time: 03/04/2021 7:19 AM Reason for block: surgical anesthesia Staffing Performed: resident/CRNA  Resident/CRNA: Nelda Marseille, CRNA Preanesthetic Checklist Completed: patient identified, IV checked, site marked, risks and benefits discussed, surgical consent, monitors and equipment checked, pre-op evaluation and timeout performed Spinal Block Patient position: sitting Prep: Betadine Patient monitoring: heart rate, continuous pulse ox, blood pressure and cardiac monitor Approach: midline Location: L4-5 Injection technique: single-shot Needle Needle type: Whitacre and Introducer  Needle gauge: 25 G Needle length: 9 cm Assessment Events: CSF return Additional Notes Negative paresthesia. Negative blood return. Positive free-flowing CSF. Expiration date of kit checked and confirmed. Patient tolerated procedure well, without complications.

## 2021-03-04 NOTE — Anesthesia Preprocedure Evaluation (Signed)
Anesthesia Evaluation  Patient identified by MRN, date of birth, ID band Patient awake    Reviewed: Allergy & Precautions, H&P , NPO status , Patient's Chart, lab work & pertinent test results, reviewed documented beta blocker date and time   Airway Mallampati: II  TM Distance: >3 FB Neck ROM: full    Dental  (+) Upper Dentures   Pulmonary shortness of breath and with exertion,    Pulmonary exam normal        Cardiovascular Exercise Tolerance: Good + CAD and + Past MI  Normal cardiovascular exam Rhythm:regular Rate:Normal     Neuro/Psych negative neurological ROS  negative psych ROS   GI/Hepatic Neg liver ROS, GERD  Medicated,  Endo/Other  Hypothyroidism   Renal/GU negative Renal ROS  negative genitourinary   Musculoskeletal   Abdominal   Peds  Hematology negative hematology ROS (+)   Anesthesia Other Findings Past Medical History: No date: Arthritis No date: B12 deficiency No date: CAD (coronary artery disease) No date: DCM (dilated cardiomyopathy) (HCC) No date: Dyspnea No date: GERD (gastroesophageal reflux disease) No date: HLD (hyperlipidemia) No date: Hypothyroidism 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 [facility]) Past Surgical History: No date: ABDOMINAL HYSTERECTOMY 2005: CARDIAC CATHETERIZATION; N/A 07/09/2016: KNEE ARTHROPLASTY; Left     Comment:  Procedure: COMPUTER ASSISTED TOTAL KNEE ARTHROPLASTY;                Surgeon: Donato Heinz, MD;  Location: [facility] ORS;                Service: Orthopedics;  Laterality: Left; BMI    Body Mass Index: 31.96 kg/m     Reproductive/Obstetrics negative OB ROS                             Anesthesia Physical Anesthesia Plan  ASA: 3  Anesthesia Plan: Spinal   Post-op Pain Management:    Induction:   PONV Risk Score and Plan: 3  Airway Management Planned:   Additional Equipment:   Intra-op Plan:   Post-operative Plan:    Informed Consent: I have reviewed the patients History and Physical, chart, labs and discussed the procedure including the risks, benefits and alternatives for the proposed anesthesia with the patient or authorized representative who has indicated his/her understanding and acceptance.     Dental Advisory Given  Plan Discussed with: CRNA  Anesthesia Plan Comments:         Anesthesia Quick Evaluation

## 2021-03-04 NOTE — Op Note (Signed)
OPERATIVE NOTE  DATE OF SURGERY:  03/04/2021  PATIENT NAME:  ILONA COLLEY   DOB: Nov 04, 1945  MRN: [id number]  PRE-OPERATIVE DIAGNOSIS: Degenerative arthrosis of the right knee, primary  POST-OPERATIVE DIAGNOSIS:  Same  PROCEDURE:  Right total knee arthroplasty using computer-assisted navigation  SURGEON:  Jena Gauss. M.D.  ASSISTANT: Baldwin Jamaica, PA-C (present and scrubbed throughout the case, critical for assistance with exposure, retraction, instrumentation, and closure)  ANESTHESIA: spinal  ESTIMATED BLOOD LOSS: 50 mL  FLUIDS REPLACED: 1300 mL of crystalloid  TOURNIQUET TIME: 86 minutes  DRAINS: 2 medium Hemovac drains  SOFT TISSUE RELEASES: Anterior cruciate ligament, posterior cruciate ligament, deep medial collateral ligament, patellofemoral ligament  IMPLANTS UTILIZED: DePuy Attune size 6 posterior stabilized femoral component (cemented), size 6 rotating platform tibial component (cemented), 38 mm medialized dome patella (cemented), and a 10 mm stabilized rotating platform polyethylene insert.  INDICATIONS FOR SURGERY: AILSA MIRELES is a 75 y.o. year old female with a long history of progressive knee pain. X-rays demonstrated severe degenerative changes in tricompartmental fashion. The patient had not seen any significant improvement despite conservative nonsurgical intervention. After discussion of the risks and benefits of surgical intervention, the patient expressed understanding of the risks benefits and agree with plans for total knee arthroplasty.   The risks, benefits, and alternatives were discussed at length including but not limited to the risks of infection, bleeding, nerve injury, stiffness, blood clots, the need for revision surgery, cardiopulmonary complications, among others, and they were willing to proceed.  PROCEDURE IN DETAIL: The patient was brought into the operating room and, after adequate spinal anesthesia was achieved, a tourniquet was  placed on the patient's upper thigh. The patient's knee and leg were cleaned and prepped with alcohol and DuraPrep and draped in the usual sterile fashion. A "timeout" was performed as per usual protocol. The lower extremity was exsanguinated using an Esmarch, and the tourniquet was inflated to 300 mmHg. An anterior longitudinal incision was made followed by a standard mid vastus approach. The deep fibers of the medial collateral ligament were elevated in a subperiosteal fashion off of the medial flare of the tibia so as to maintain a continuous soft tissue sleeve. The patella was subluxed laterally and the patellofemoral ligament was incised. Inspection of the knee demonstrated severe degenerative changes with full-thickness loss of articular cartilage. Osteophytes were debrided using a rongeur. Anterior and posterior cruciate ligaments were excised. Two 4.0 mm Schanz pins were inserted in the femur and into the tibia for attachment of the array of trackers used for computer-assisted navigation. Hip center was identified using a circumduction technique. Distal landmarks were mapped using the computer. The distal femur and proximal tibia were mapped using the computer. The distal femoral cutting guide was positioned using computer-assisted navigation so as to achieve a 5 distal valgus cut. The femur was sized and it was felt that a size 6 femoral component was appropriate. A size 6 femoral cutting guide was positioned and the anterior cut was performed and verified using the computer. This was followed by completion of the posterior and chamfer cuts. Femoral cutting guide for the central box was then positioned in the center box cut was performed.  Attention was then directed to the proximal tibia. Medial and lateral menisci were excised. The extramedullary tibial cutting guide was positioned using computer-assisted navigation so as to achieve a 0 varus-valgus alignment and 3 posterior slope. The cut was  performed and verified using the computer. The proximal tibia was  sized and it was felt that a size 6 tibial tray was appropriate. Tibial and femoral trials were inserted followed by insertion of a 10 mm polyethylene insert. This allowed for excellent mediolateral soft tissue balancing both in flexion and in full extension. Finally, the patella was cut and prepared so as to accommodate a 38 mm medialized dome patella. A patella trial was placed and the knee was placed through a range of motion with excellent patellar tracking appreciated. The femoral trial was removed after debridement of posterior osteophytes. The central post-hole for the tibial component was reamed followed by insertion of a keel punch. Tibial trials were then removed. Cut surfaces of bone were irrigated with copious amounts of normal saline using pulsatile lavage and then suctioned dry. Polymethylmethacrylate cement was prepared in the usual fashion using a vacuum mixer. Cement was applied to the cut surface of the proximal tibia as well as along the undersurface of a size 6 rotating platform tibial component. Tibial component was positioned and impacted into place. Excess cement was removed using Personal assistant. Cement was then applied to the cut surfaces of the femur as well as along the posterior flanges of the size 6 femoral component. The femoral component was positioned and impacted into place. Excess cement was removed using Personal assistant. A 10 mm polyethylene trial was inserted and the knee was brought into full extension with steady axial compression applied. Finally, cement was applied to the backside of a 38 mm medialized dome patella and the patellar component was positioned and patellar clamp applied. Excess cement was removed using Personal assistant. After adequate curing of the cement, the tourniquet was deflated after a total tourniquet time of 86 minutes. Hemostasis was achieved using electrocautery. The knee was irrigated with  copious amounts of normal saline using pulsatile lavage followed by 500 ml of Surgiphor and then suctioned dry. 20 mL of 1.3% Exparel and 60 mL of 0.25% Marcaine in 40 mL of normal saline was injected along the posterior capsule, medial and lateral gutters, and along the arthrotomy site. A 10 mm stabilized rotating platform polyethylene insert was inserted and the knee was placed through a range of motion with excellent mediolateral soft tissue balancing appreciated and excellent patellar tracking noted. 2 medium drains were placed in the wound bed and brought out through separate stab incisions. The medial parapatellar portion of the incision was reapproximated using interrupted sutures of #1 Vicryl. Subcutaneous tissue was approximated in layers using first #0 Vicryl followed #2-0 Vicryl. The skin was approximated with skin staples. A sterile dressing was applied.  The patient tolerated the procedure well and was transported to the recovery room in stable condition.    Tomie Elko P. Angie Fava., M.D.

## 2021-03-04 NOTE — H&P (Signed)
The patient has been re-examined, and the chart reviewed, and there have been no interval changes to the documented history and physical.    The risks, benefits, and alternatives have been discussed at length. The patient expressed understanding of the risks benefits and agreed with plans for surgical intervention.  Fischer Halley P. Kiyoto Slomski, Jr. M.D.    

## 2021-03-04 NOTE — Plan of Care (Signed)
  Problem: Education: Goal: Knowledge of General Education information will improve Description: Including pain rating scale, medication(s)/side effects and non-pharmacologic comfort measures Outcome: Progressing   Problem: Health Behavior/Discharge Planning: Goal: Ability to manage health-related needs will improve Outcome: Progressing   Problem: Clinical Measurements: Goal: Ability to maintain clinical measurements within normal limits will improve Outcome: Progressing Goal: Will remain free from infection Outcome: Progressing Goal: Diagnostic test results will improve Outcome: Progressing Goal: Respiratory complications will improve Outcome: Progressing Goal: Cardiovascular complication will be avoided Outcome: Progressing   Problem: Activity: Goal: Risk for activity intolerance will decrease Outcome: Progressing   Problem: Nutrition: Goal: Adequate nutrition will be maintained Outcome: Progressing   Problem: Coping: Goal: Level of anxiety will decrease Outcome: Progressing   Problem: Elimination: Goal: Will not experience complications related to bowel motility Outcome: Progressing Goal: Will not experience complications related to urinary retention Outcome: Progressing   Problem: Pain Managment: Goal: General experience of comfort will improve Outcome: Progressing   Problem: Safety: Goal: Ability to remain free from injury will improve Outcome: Progressing   Problem: Skin Integrity: Goal: Risk for impaired skin integrity will decrease Outcome: Progressing   Problem: Education: Goal: Knowledge of the prescribed therapeutic regimen will improve Outcome: Progressing Goal: Individualized Educational Video(s) Outcome: Progressing   Problem: Activity: Goal: Ability to avoid complications of mobility impairment will improve Outcome: Progressing Goal: Range of joint motion will improve Outcome: Progressing   Problem: Clinical Measurements: Goal:  Postoperative complications will be avoided or minimized Outcome: Progressing   Problem: Pain Management: Goal: Pain level will decrease with appropriate interventions Outcome: Progressing   

## 2021-03-04 NOTE — Transfer of Care (Signed)
Immediate Anesthesia Transfer of Care Note  Patient: Cindy Harper  Procedure(s) Performed: COMPUTER ASSISTED TOTAL KNEE ARTHROPLASTY (Right: Knee)  Patient Location: PACU  Anesthesia Type:Spinal  Level of Consciousness: awake and sedated  Airway & Oxygen Therapy: Patient Spontanous Breathing and Patient connected to face mask oxygen  Post-op Assessment: Report given to RN and Post -op Vital signs reviewed and stable  Post vital signs: Reviewed and stable  Last Vitals:  Vitals Value Taken Time  BP 91/56 03/04/21 1054  Temp 36.6 C 03/04/21 1048  Pulse 83 03/04/21 1054  Resp 20 03/04/21 1054  SpO2 97 % 03/04/21 1054  Vitals shown include unvalidated device data.  Last Pain:  Vitals:   03/04/21 1048  TempSrc:   PainSc: 0-No pain         Complications: No notable events documented.

## 2021-03-05 MED ORDER — CELECOXIB 200 MG PO CAPS: 200.0000 mg | ORAL_CAPSULE | Freq: Two times a day (BID) | ORAL | 0 refills | Status: DC

## 2021-03-05 MED ORDER — TRAMADOL HCL 50 MG PO TABS: 50.0000 mg | ORAL_TABLET | ORAL | 0 refills | Status: DC | PRN

## 2021-03-05 MED ORDER — ENOXAPARIN SODIUM 40 MG/0.4ML IJ SOSY: 40.0000 mg | PREFILLED_SYRINGE | INTRAMUSCULAR | 0 refills | Status: DC

## 2021-03-05 NOTE — Progress Notes (Signed)
Pts BP 92/47. MD Hooten notified. No new orders

## 2021-03-05 NOTE — Anesthesia Postprocedure Evaluation (Signed)
Anesthesia Post Note  Patient: Cindy Harper  Procedure(s) Performed: COMPUTER ASSISTED TOTAL KNEE ARTHROPLASTY (Right: Knee)  Patient location during evaluation: Nursing Unit Anesthesia Type: Spinal Level of consciousness: oriented and awake and alert Pain management: pain level controlled Vital Signs Assessment: post-procedure vital signs reviewed and stable Respiratory status: spontaneous breathing and respiratory function stable Cardiovascular status: blood pressure returned to baseline and stable Postop Assessment: no headache, no backache, no apparent nausea or vomiting and patient able to bend at knees Anesthetic complications: no   No notable events documented.   Last Vitals:  Vitals:   03/05/21 0406 03/05/21 0804  BP: (!) 105/56 (!) 92/47  Pulse: (!) 51 (!) 51  Resp: 18 18  Temp: 36.5 C 37.2 C  SpO2: 97% 100%    Last Pain:  Vitals:   03/05/21 0804  TempSrc: Oral  PainSc:                  [name]

## 2021-03-05 NOTE — Plan of Care (Signed)
Post-op dressing removed. , Hemovac removed., Mini compression dressing applied. , and Fresh honeycomb dressing applied.   

## 2021-03-05 NOTE — Evaluation (Signed)
Occupational Therapy Evaluation Patient Details Name: Cindy Harper MRN: [id number] DOB: 1945-09-30 Today's Date: 03/05/2021    History of Present Illness 75yo female s/p R TKA on 03/04/21. PMHx includes CAD, past MI (2005), thyroid disease, GERd, HLD, OA, L TKA.   Clinical Impression   Pt seen for OT evaluation this date, POD#1 from above surgery. Pt was independent in all ADL prior to surgery and is eager to return to PLOF with less pain and improved safety and independence. Pt reports plan to stay with her son during recovery. Pt currently requires PRN MIN assist for LB dressing and bathing while in seated position due to pain and limited AROM of R knee. Pt instructed in polar care mgt, falls prevention strategies, home/routines modifications, DME/AE for LB bathing and dressing tasks, and compression stocking mgt. Handout provided to support recall and carryover. Pt verbalized understanding. Pt performed bed mobility with SUPV, CGA - MIN A for ADL transfer + RW. BP low prior to session, orthostatics taken, negative and asymptomatic, but still low (see below for additional detail). RN notified. Pt would benefit from skilled OT services including additional instruction in dressing techniques with or without assistive devices for dressing and bathing skills to support recall and carryover prior to discharge and ultimately to maximize safety, independence, and minimize falls risk and caregiver burden. Do not currently anticipate any OT needs following this hospitalization.      Follow Up Recommendations  No OT follow up    Equipment Recommendations  3 in 1 bedside commode    Recommendations for Other Services       Precautions / Restrictions Precautions Precautions: Fall Precaution Comments: BP and HR have been a little low Restrictions Weight Bearing Restrictions: Yes RLE Weight Bearing: Weight bearing as tolerated      Mobility Bed Mobility Overal bed mobility: Needs Assistance Bed  Mobility: Supine to Sit     Supine to sit: Supervision          Transfers Overall transfer level: Needs assistance Equipment used: Rolling walker (2 wheeled) Transfers: Sit to/from Stand Sit to Stand: Min assist              Balance Overall balance assessment: Needs assistance Sitting-balance support: Feet supported Sitting balance-Leahy Scale: Good     Standing balance support: Single extremity supported;During functional activity Standing balance-Leahy Scale: Fair Standing balance comment: able to hold on w 1 UE while OT got BP on other in standing                           ADL either performed or assessed with clinical judgement   ADL Overall ADL's : Needs assistance/impaired                                       General ADL Comments: Pt requires PRN MIN A for LB ADL tasks 2/2 decreased strength, ROM, and pain in R knee. CGA-MIN A for ADL transfers + RW.     Vision Baseline Vision/History: Wears glasses Wears Glasses: At all times Patient Visual Report: No change from baseline Vision Assessment?: No apparent visual deficits     Perception     Praxis      Pertinent Vitals/Pain Pain Assessment: 0-10 Pain Score: 4  Pain Location: R knee Pain Descriptors / Indicators: Guarding;Grimacing;Sore Pain Intervention(s): Limited activity within patient's tolerance;Monitored during session;Premedicated before  session;Repositioned;Ice applied     Hand Dominance Right   Extremity/Trunk Assessment Upper Extremity Assessment Upper Extremity Assessment: Overall WFL for tasks assessed   Lower Extremity Assessment Lower Extremity Assessment: RLE deficits/detail RLE Deficits / Details: R TKA   Cervical / Trunk Assessment Cervical / Trunk Assessment: Normal   Communication Communication Communication: No difficulties   Cognition Arousal/Alertness: Awake/alert Behavior During Therapy: WFL for tasks assessed/performed Overall Cognitive  Status: Within Functional Limits for tasks assessed                                     General Comments  Supine BP 97/49, sitting BP 102/43, standing BP 0 min 89/49, standing BP 85/55. HR 44-51. Asymptomatic. RN notified.    Exercises Other Exercises Other Exercises: Pt instructed in falls prevention, home/routines modifications, polar care mgt, compression stocking mgt, and AE/DME; handout provided   Shoulder Instructions      Home Living Family/patient expects to be discharged to:: Private residence Living Arrangements: Children Available Help at Discharge: Family Type of Home: House Home Access: Ramped entrance     Home Layout: One level     Bathroom Shower/Tub: Tub/shower unit;Walk-in shower   Bathroom Toilet: Handicapped height     Home Equipment: Environmental consultant - 2 wheels   Additional Comments: pt plans to discharging to her sons house      Prior Functioning/Environment Level of Independence: Independent                 OT Problem List: Decreased strength;Pain;Decreased range of motion;Impaired balance (sitting and/or standing);Decreased knowledge of use of DME or AE      OT Treatment/Interventions: Self-care/ADL training;Therapeutic exercise;Therapeutic activities;DME and/or AE instruction;Patient/family education;Balance training    OT Goals(Current goals can be found in the care plan section) Acute Rehab OT Goals Patient Stated Goal: get stronger and go home wiht my son to recover OT Goal Formulation: With patient Time For Goal Achievement: 03/19/21 Potential to Achieve Goals: Good ADL Goals Pt Will Perform Lower Body Dressing: with modified independence;sit to/from stand Pt Will Transfer to Toilet: with modified independence;ambulating (elevated commode, LRAD for amb) Additional ADL Goal #1: Pt will independently instruct family in polar care mgt Additional ADL Goal #2: Pt will independently instruct family in compression stocking mgt   OT Frequency: Min 1X/week   Barriers to D/C:            Co-evaluation              AM-PAC OT "6 Clicks" Daily Activity     Outcome Measure Help from another person eating meals?: None Help from another person taking care of personal grooming?: None Help from another person toileting, which includes using toliet, bedpan, or urinal?: A Little Help from another person bathing (including washing, rinsing, drying)?: A Little Help from another person to put on and taking off regular upper body clothing?: None Help from another person to put on and taking off regular lower body clothing?: A Little 6 Click Score: 21   End of Session Equipment Utilized During Treatment: Rolling walker  Activity Tolerance: Patient tolerated treatment well Patient left: in chair;with call bell/phone within reach;with chair alarm set;Other (comment) (polar car ein place, rolled towel under R ankle)  OT Visit Diagnosis: Other abnormalities of gait and mobility (R26.89);Pain Pain - Right/Left: Right Pain - part of body: Knee  Time: 1275-1700 OT Time Calculation (min): 26 min Charges:  OT General Charges $OT Visit: 1 Visit OT Evaluation $OT Eval Low Complexity: 1 Low OT Treatments $Self Care/Home Management : 8-22 mins  Wynona Canes, MPH, MS, OTR/L ascom [phone] 03/05/21, 10:49 AM

## 2021-03-05 NOTE — Progress Notes (Signed)
Physical Therapy Treatment Patient Details Name: Cindy Harper MRN: [id number] DOB: [birth year] Today's Date: 03/05/2021    History of Present Illness 75yo female s/p R TKA on 03/04/21. PMHx includes CAD, past MI (2005), thyroid disease, GERd, HLD, OA, L TKA.    PT Comments    Pt received seated in recliner upon arrival to room.  Pt agreeable to therapy.  Pt was able to transfer and ambulate around the nursing station x2.  Pt with good technique and demonstrated good strength during mobility.  Pt was able to increase step length and knee flexion during ambulation while remaining stable during gait training as well.  Pt then transferred to bed and nursing notified of mobility status and recommendation of ambulating as often as possible for strengthening and assisting with bowel movement.  Current discharge plans to HHPT remain appropriate at this time.  Pt will continue to benefit from skilled therapy in order to address deficits listed below.    Follow Up Recommendations  Home health PT;Supervision/Assistance - 24 hour     Equipment Recommendations  3in1 (PT)    Recommendations for Other Services       Precautions / Restrictions Precautions Precautions: Fall Precaution Comments: BP and HR have been a little low Restrictions Weight Bearing Restrictions: Yes RLE Weight Bearing: Weight bearing as tolerated    Mobility  Bed Mobility Overal bed mobility: Needs Assistance Bed Mobility: Supine to Sit     Supine to sit: Supervision          Transfers Overall transfer level: Needs assistance Equipment used: Rolling walker (2 wheeled) Transfers: Sit to/from Stand Sit to Stand: Supervision            Ambulation/Gait Ambulation/Gait assistance: Min guard Gait Distance (Feet): 340 Feet Assistive device: Rolling walker (2 wheeled) Gait Pattern/deviations: Step-through pattern;Decreased step length - left;Decreased stance time - right Gait velocity: decreased   General  Gait Details: Pt with good technique during ambulation.  Minimally decreased speed and gait mechanics.   Stairs Stairs: Yes Stairs assistance: Min guard Stair Management: No rails;Step to pattern;Backwards;With walker Number of Stairs: 4 General stair comments: Pt with excellent recall from visual and verbal cues by therapist.  Pt with good safe body mechnics.   Wheelchair Mobility    Modified Rankin (Stroke Patients Only)       Balance Overall balance assessment: Needs assistance Sitting-balance support: Feet supported Sitting balance-Leahy Scale: Good     Standing balance support: Bilateral upper extremity supported Standing balance-Leahy Scale: Fair Standing balance comment: during ambulation             High level balance activites: Head turns High Level Balance Comments: Pt does veer to which side she looks when ambulating, but minimally.            Cognition Arousal/Alertness: Awake/alert Behavior During Therapy: WFL for tasks assessed/performed Overall Cognitive Status: Within Functional Limits for tasks assessed                                        Exercises      General Comments        Pertinent Vitals/Pain Pain Assessment: 0-10 Pain Score: 5  Pain Location: R knee Pain Descriptors / Indicators: Guarding;Grimacing;Sore Pain Intervention(s): Limited activity within patient's tolerance;Monitored during session;Premedicated before session;Repositioned;Ice applied    Home Living  Prior Function            PT Goals (current goals can now be found in the care plan section) Acute Rehab PT Goals Patient Stated Goal: to go home PT Goal Formulation: With patient Time For Goal Achievement: 03/18/21 Potential to Achieve Goals: Good Progress towards PT goals: Progressing toward goals    Frequency    BID      PT Plan Current plan remains appropriate    Co-evaluation               AM-PAC PT "6 Clicks" Mobility   Outcome Measure  Help needed turning from your back to your side while in a flat bed without using bedrails?: A Little Help needed moving from lying on your back to sitting on the side of a flat bed without using bedrails?: A Little Help needed moving to and from a bed to a chair (including a wheelchair)?: A Little Help needed standing up from a chair using your arms (e.g., wheelchair or bedside chair)?: A Little Help needed to walk in hospital room?: A Little Help needed climbing 3-5 steps with a railing? : A Little 6 Click Score: 18    End of Session Equipment Utilized During Treatment: Gait belt Activity Tolerance: Patient tolerated treatment well Patient left: in chair;with chair alarm set;with call bell/phone within reach Nurse Communication: Mobility status PT Visit Diagnosis: Other abnormalities of gait and mobility (R26.89);Muscle weakness (generalized) (M62.81);Difficulty in walking, not elsewhere classified (R26.2);Pain Pain - Right/Left: Right Pain - part of body: Knee     Time: 8413-2440 PT Time Calculation (min) (ACUTE ONLY): 29 min  Charges:  $Gait Training: 23-37 mins                     Cindy Harper, PT, DPT 03/05/21, 3:57 PM    Cindy Harper 03/05/2021, 3:52 PM

## 2021-03-05 NOTE — Progress Notes (Signed)
  Subjective: 1 Day Post-Op Procedure(s) (LRB): COMPUTER ASSISTED TOTAL KNEE ARTHROPLASTY (Right) Patient reports pain as well-controlled.   Patient is well, but has had some minor complaints of dizziness this AM Plan is to go Home after hospital stay. Negative for chest pain and shortness of breath Fever: no Gastrointestinal: negative for nausea and vomiting.  Patient has not had a bowel movement.  Objective: Vital signs in last 24 hours: Temp:  [97.4 F (36.3 C)-98.9 F (37.2 C)] 98.9 F (37.2 C) (06/21 0804) Pulse Rate:  [49-83] 51 (06/21 0804) Resp:  [14-23] 18 (06/21 0804) BP: (83-109)/(47-77) 92/47 (06/21 0804) SpO2:  [94 %-100 %] 100 % (06/21 0804)  Intake/Output from previous day:  Intake/Output Summary (Last 24 hours) at 03/05/2021 0832 Last data filed at 03/05/2021 0543 Gross per 24 hour  Intake 2670.74 ml  Output 2525 ml  Net 145.74 ml    Intake/Output this shift: No intake/output data recorded.  Labs: No results for input(s): HGB in the last 72 hours. No results for input(s): WBC, RBC, HCT, PLT in the last 72 hours. No results for input(s): NA, K, CL, CO2, BUN, CREATININE, GLUCOSE, CALCIUM in the last 72 hours. No results for input(s): LABPT, INR in the last 72 hours.   EXAM General - Patient is Alert, Appropriate, and Oriented Extremity - Neurovascular intact Dorsiflexion/Plantar flexion intact Compartment soft Dressing/Incision -Postoperative dressing remains in place., Polar Care in place and working. , Hemovac in place.  Motor Function - intact, moving foot and toes well on exam. Able to perform independent SLR.     Assessment/Plan: 1 Day Post-Op Procedure(s) (LRB): COMPUTER ASSISTED TOTAL KNEE ARTHROPLASTY (Right) Active Problems:   Total knee replacement status  Estimated body mass index is 31.96 kg/m as calculated from the following:   Height as of this encounter: 5\' 6"  (1.676 m).   Weight as of this encounter: 89.8 kg. Advance diet Up  with therapy       DVT Prophylaxis - Lovenox, Ted hose, and foot pumps Weight-Bearing as tolerated to right leg  , PA-C [facility] 03/05/2021, 8:32 AM

## 2021-03-05 NOTE — Progress Notes (Signed)
Physical Therapy Treatment Patient Details Name: Cindy Harper MRN: [id number] DOB: 1945-11-09 Today's Date: 03/05/2021    History of Present Illness 75yo female s/p R TKA on 03/04/21. PMHx includes CAD, past MI (2005), thyroid disease, GERd, HLD, OA, L TKA.    PT Comments    Pt received sitting in recliner upon arrival to room and agreeable to therapy.  Pt was able to transfer with good technique and ambulate around the nursing station.  Pt participated in stair training and has great recall of instructions by therapist with use of verbal and visual cuing.  Pt was able to safely navigate stairs with no rails and use of the walker to ascend backwards on the 4 steps.  Pt then transferred back to the recliner where all needs met and pt was given call bell.  Current discharge plans to SNF remain appropriate at this time.  Pt will continue to benefit from skilled therapy in order to address deficits listed below.    Follow Up Recommendations  Home health PT;Supervision/Assistance - 24 hour     Equipment Recommendations  3in1 (PT)    Recommendations for Other Services       Precautions / Restrictions Precautions Precautions: Fall Precaution Comments: BP and HR have been a little low Restrictions Weight Bearing Restrictions: Yes RLE Weight Bearing: Weight bearing as tolerated    Mobility  Bed Mobility Overal bed mobility: Needs Assistance Bed Mobility: Supine to Sit     Supine to sit: Supervision          Transfers Overall transfer level: Needs assistance Equipment used: Rolling walker (2 wheeled) Transfers: Sit to/from Stand Sit to Stand: Min assist            Ambulation/Gait Ambulation/Gait assistance: Min guard Gait Distance (Feet): 180 Feet Assistive device: Rolling walker (2 wheeled) Gait Pattern/deviations: Step-through pattern;Decreased step length - left;Decreased stance time - right Gait velocity: decreased   General Gait Details: Pt with good  technique during ambulation.  Minimally decreased speed and gait mechanics.   Stairs Stairs: Yes Stairs assistance: Min guard Stair Management: No rails;Step to pattern;Backwards;With walker Number of Stairs: 4 General stair comments: Pt with excellent recall from visual and verbal cues by therapist.  Pt with good safe body mechnics.   Wheelchair Mobility    Modified Rankin (Stroke Patients Only)       Balance Overall balance assessment: Needs assistance Sitting-balance support: Feet supported Sitting balance-Leahy Scale: Good     Standing balance support: Bilateral upper extremity supported Standing balance-Leahy Scale: Fair Standing balance comment: during ambulation             High level balance activites: Head turns High Level Balance Comments: Pt does veer to which side she looks when ambulating, but minimally.            Cognition Arousal/Alertness: Awake/alert Behavior During Therapy: WFL for tasks assessed/performed Overall Cognitive Status: Within Functional Limits for tasks assessed                                        Exercises Other Exercises Other Exercises: Pt instructed in falls prevention, home/routines modifications, polar care mgt, compression stocking mgt, and AE/DME; handout provided    General Comments General comments (skin integrity, edema, etc.): Supine BP 97/49, sitting BP 102/43, standing BP 0 min 89/49, standing BP 83mn 85/55. HR 44-51. Asymptomatic. RN notified.  Pertinent Vitals/Pain Pain Assessment: 0-10 Pain Score: 4  Pain Location: R knee Pain Descriptors / Indicators: Guarding;Grimacing;Sore Pain Intervention(s): Limited activity within patient's tolerance;Monitored during session;Premedicated before session;Repositioned;Ice applied    Home Living Family/patient expects to be discharged to:: Private residence Living Arrangements: Children Available Help at Discharge: Family Type of Home:  House Home Access: [place]: One level Home Equipment: Environmental consultant - 2 wheels Additional Comments: pt plans to discharging to her sons house    Prior Function Level of Independence: Independent          PT Goals (current goals can now be found in the care plan section) Acute Rehab PT Goals Patient Stated Goal: to go home PT Goal Formulation: With patient Time For Goal Achievement: 03/18/21 Potential to Achieve Goals: Good Progress towards PT goals: Progressing toward goals    Frequency    BID      PT Plan Current plan remains appropriate    Co-evaluation              AM-PAC PT "6 Clicks" Mobility   Outcome Measure  Help needed turning from your back to your side while in a flat bed without using bedrails?: A Little Help needed moving from lying on your back to sitting on the side of a flat bed without using bedrails?: A Little Help needed moving to and from a bed to a chair (including a wheelchair)?: A Little Help needed standing up from a chair using your arms (e.g., wheelchair or bedside chair)?: A Little Help needed to walk in hospital room?: A Little Help needed climbing 3-5 steps with a railing? : A Little 6 Click Score: 18    End of Session Equipment Utilized During Treatment: Gait belt Activity Tolerance: Patient tolerated treatment well Patient left: in chair;with chair alarm set;with call bell/phone within reach Nurse Communication: Mobility status PT Visit Diagnosis: Other abnormalities of gait and mobility (R26.89);Muscle weakness (generalized) (M62.81);Difficulty in walking, not elsewhere classified (R26.2);Pain Pain - Right/Left: Right Pain - part of body: Knee     Time: 1021-1050 PT Time Calculation (min) (ACUTE ONLY): 29 min  Charges:  $Gait Training: 23-37 mins                     Gwenlyn Saran, PT, DPT 03/05/21, 1:28 PM    [name] 03/05/2021, 1:26 PM

## 2021-03-05 NOTE — TOC Progression Note (Signed)
Transition of Care [facility]) - Progression Note    Patient Details  Name: Cindy Harper MRN: [id number] Date of Birth: Dec 22, 1945  Transition of Care [facility]) CM/SW Contact  Barrie Dunker, RN Phone Number: 03/05/2021, 9:19 AM  Clinical Narrative:     Spoke to the patient at the bedside and discussed DC plan and needs She has a 3 in 1 and a RW at home, she will be going to her son's home [phone] [place], She has used [facility] in the past and would like to use them again, I notified [place] [facility] of the updated address, the patient has transportation and can afford her medications No additional needs at this time  Expected Discharge Plan: Home w Home Health Services Barriers to Discharge: Barriers Resolved  Expected Discharge Plan and Services Expected Discharge Plan: Home w Home Health Services   Discharge Planning Services: CM Consult   Living arrangements for the past 2 months: Single Family Home                 DME Arranged: N/A         HH Arranged: PT HH Agency: [facility] (now [facility]) Date HH Agency Contacted: (S) 02/26/21 Time HH Agency Contacted: [phone] Representative spoke with at [facility] Agency: [place]   Social Determinants of Health (SDOH) Interventions    Readmission Risk Interventions No flowsheet data found.

## 2021-03-05 NOTE — Discharge Summary (Addendum)
Physician Discharge Summary  Patient ID: Cindy Harper MRN: [id number] DOB/AGE: [id number] y.o.  Admit date: 03/04/2021 Discharge date: 03/06/2021  Admission Diagnoses:  Total knee replacement status [Z96.659]  Surgeries:Procedure(s):  Right total knee arthroplasty using computer-assisted navigation   SURGEON:  Jena Gauss. M.D.   ASSISTANT: Baldwin Jamaica, PA-C (present and scrubbed throughout the case, critical for assistance with exposure, retraction, instrumentation, and closure)   ANESTHESIA: spinal   ESTIMATED BLOOD LOSS: 50 mL   FLUIDS REPLACED: 1300 mL of crystalloid   TOURNIQUET TIME: 86 minutes   DRAINS: 2 medium Hemovac drains   SOFT TISSUE RELEASES: Anterior cruciate ligament, posterior cruciate ligament, deep medial collateral ligament, patellofemoral ligament   IMPLANTS UTILIZED: DePuy Attune size 6 posterior stabilized femoral component (cemented), size 6 rotating platform tibial component (cemented), 38 mm medialized dome patella (cemented), and a 10 mm stabilized rotating platform polyethylene insert.  Discharge Diagnoses: Patient Active Problem List   Diagnosis Date Noted   Total knee replacement status 03/04/2021   B12 deficiency 02/18/2021   Primary osteoarthritis of right knee 11/09/2020   Cardiomyopathy (HCC) 04/01/2018   Acquired hypothyroidism 01/27/2017   Status post total left knee replacement 07/09/2016   Hyperlipidemia, mixed 07/11/2014   CAD (coronary artery disease), native coronary artery 12/05/2013    Past Medical History:  Diagnosis Date   Arthritis    B12 deficiency    CAD (coronary artery disease)    DCM (dilated cardiomyopathy) (HCC)    Dyspnea    GERD (gastroesophageal reflux disease)    HLD (hyperlipidemia)    Hypothyroidism    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 female who was  admitted 03/04/2021 with a diagnosis of right knee osteoarthritis and went to the operating room on 03/04/2021 and underwent right total knee arthroplasty. The patient received perioperative antibiotics for prophylaxis (see below). The patient tolerated the procedure well and was transported to PACU in stable condition. After meeting PACU criteria, the patient was subsequently transferred to the Orthopaedics/Rehabilitation unit.   The patient received DVT prophylaxis in the form of early mobilization, Lovenox, Foot Pumps, and TED hose. A sacral pad had been placed and heels were elevated off of the bed with rolled towels in order to protect skin integrity. Foley catheter was discontinued on postoperative day #0. Wound drains were discontinued on postoperative day #1. The surgical incision was healing well without signs of infection.  Physical therapy was initiated postoperatively for transfers, gait training, and strengthening. Occupational therapy was initiated for activities of daily living and evaluation for assisted devices. Rehabilitation goals were reviewed in detail with the patient. The patient made steady progress with physical therapy and physical therapy recommended discharge to Home.   The patient achieved the preliminary goals of this hospitalization and was felt to be medically and orthopaedically appropriate for discharge.  She was given perioperative antibiotics:  Anti-infectives (From admission, onward)    Start     Dose/Rate Route Frequency Ordered Stop   03/04/21 1330  ceFAZolin (ANCEF) IVPB 2g/100 mL premix        2 g 200 mL/hr over 30 Minutes Intravenous Every 6 hours 03/04/21 1132 03/04/21 1855   03/04/21 0612  ceFAZolin (ANCEF) 2-4 GM/100ML-% IVPB       Note to Pharmacy: Christene Slates   : cabinet override      03/04/21  [id number] 03/04/21 0731   03/04/21 0600  ceFAZolin (ANCEF) IVPB 2g/100 mL premix        2 g 200 mL/hr over 30 Minutes Intravenous On call to O.R. 03/04/21 [id number]  03/04/21 0741     .  Recent vital signs:  Vitals:   03/05/21 2008 03/06/21 0433  BP: (!) 106/58 (!) 113/58  Pulse: (!) 53 65  Resp: 18 16  Temp: (!) 97.3 F (36.3 C) 97.7 F (36.5 C)  SpO2: 100% 96%    Recent laboratory studies:  No results for input(s): WBC, HGB, HCT, PLT, K, CL, CO2, BUN, CREATININE, GLUCOSE, CALCIUM, LABPT, INR in the last 72 hours.  Diagnostic Studies: DG Knee Right Port  Result Date: 03/04/2021 CLINICAL DATA:  Postoperative right knee, status post total arthroplasty EXAM: PORTABLE RIGHT KNEE - 1-2 VIEW COMPARISON:  None. FINDINGS: Status post right knee total arthroplasty with expected overlying postoperative changes. No evidence of perihardware fracture or component malpositioning. IMPRESSION: Status post right knee total arthroplasty with expected overlying postoperative changes. No evidence of perihardware fracture or component malpositioning. Electronically Signed   By: Lauralyn Primes M.D.   On: 03/04/2021 11:37    Discharge Medications:   Allergies as of 03/06/2021       Reactions   Ezetimibe-simvastatin    Muscle Pain        Medication List     STOP taking these medications    aspirin 81 MG chewable tablet   nitrofurantoin (macrocrystal-monohydrate) 100 MG capsule Commonly known as: MACROBID   sulfamethoxazole-trimethoprim 800-160 MG tablet Commonly known as: BACTRIM DS       TAKE these medications    acetaminophen 500 MG tablet Commonly known as: TYLENOL Take 500-1,000 mg by mouth every 6 (six) hours as needed for mild pain or headache.   B-12 3000 MCG Caps Take 3,000 mcg by mouth every other day.   celecoxib 200 MG capsule Commonly known as: CELEBREX Take 1 capsule (200 mg total) by mouth 2 (two) times daily.   enoxaparin 40 MG/0.4ML injection Commonly known as: LOVENOX Inject 0.4 mLs (40 mg total) into the skin daily for 14 days.   ibuprofen 200 MG tablet Commonly known as: ADVIL Take 200 mg by mouth every 6 (six) hours  as needed for moderate pain or headache.   levothyroxine 100 MCG tablet Commonly known as: SYNTHROID Take 100 mcg by mouth daily before breakfast.   multivitamin with minerals Tabs tablet Take 1 tablet by mouth daily.   ondansetron 4 MG disintegrating tablet Commonly known as: ZOFRAN-ODT Take 1 tablet (4 mg total) by mouth every 8 (eight) hours as needed for nausea or vomiting.   traMADol 50 MG tablet Commonly known as: ULTRAM Take 1 tablet (50 mg total) by mouth every 4 (four) hours as needed for moderate pain.               Durable Medical Equipment  (From admission, onward)           Start     Ordered   03/04/21 1133  DME Walker rolling  Once       Question:  Patient needs a walker to treat with the following condition  Answer:  Total knee replacement status   03/04/21 1132   03/04/21 1133  DME Bedside commode  Once       Question:  Patient needs a bedside commode to treat with the following condition  Answer:  Total knee replacement status   03/04/21 1132  Disposition: Home with home health PT     Follow-up Information     Tera Partridge, [place] On 03/20/2021.   Specialty: Physician Assistant Why: at 9:15am Contact information: [address] [place] [address] [phone]         Donato Heinz, MD On 04/16/2021.   Specialty: Orthopedic Surgery Why: at 9:45am Contact information: [address] [facility] [place] [address] [phone]                  Lasandra Beech, PA-C 03/06/2021, 7:02 AM

## 2021-03-06 NOTE — Progress Notes (Signed)
  Subjective: 2 Days Post-Op Procedure(s) (LRB): COMPUTER ASSISTED TOTAL KNEE ARTHROPLASTY (Right) Patient reports pain as well-controlled.   Patient is well, and has had no acute complaints or problems Plan is to go Home after hospital stay. Negative for chest pain and shortness of breath Fever: no Gastrointestinal: negative for nausea and vomiting.  Patient has had a bowel movement.  Objective: Vital signs in last 24 hours: Temp:  [97.3 F (36.3 C)-98.9 F (37.2 C)] 97.7 F (36.5 C) (06/22 0433) Pulse Rate:  [51-65] 65 (06/22 0433) Resp:  [16-18] 16 (06/22 0433) BP: (92-113)/(47-65) 113/58 (06/22 0433) SpO2:  [96 %-100 %] 96 % (06/22 0433)  Intake/Output from previous day:  Intake/Output Summary (Last 24 hours) at 03/06/2021 0716 Last data filed at 03/06/2021 0200 Gross per 24 hour  Intake 740.45 ml  Output --  Net 740.45 ml    Intake/Output this shift: No intake/output data recorded.  Labs: No results for input(s): HGB in the last 72 hours. No results for input(s): WBC, RBC, HCT, PLT in the last 72 hours. No results for input(s): NA, K, CL, CO2, BUN, CREATININE, GLUCOSE, CALCIUM in the last 72 hours. No results for input(s): LABPT, INR in the last 72 hours.   EXAM General - Patient is Alert, Appropriate, and Oriented Extremity - Neurovascular intact Dorsiflexion/Plantar flexion intact Compartment soft Dressing/Incision -mild to moderate sanguinous drainage noted Motor Function - intact, moving foot and toes well on exam. Able to perform independent SLR.  Cardiovascular- Regular rate and rhythm, no murmurs/rubs/gallops Respiratory- Lungs clear to auscultation bilaterally Gastrointestinal- soft and nontender   Assessment/Plan: 2 Days Post-Op Procedure(s) (LRB): COMPUTER ASSISTED TOTAL KNEE ARTHROPLASTY (Right) Active Problems:   Total knee replacement status  Estimated body mass index is 31.96 kg/m as calculated from the following:   Height as of this  encounter: 5\' 6"  (1.676 m).   Weight as of this encounter: 89.8 kg. Discharge home with home health    Mini compression dressing applied.  and Fresh honeycomb dressing applied.  TED hose applied to operative leg.  DVT Prophylaxis - Lovenox, Ted hose, and foot pumps Weight-Bearing as tolerated to right leg  , PA-C [facility] Orthopaedic Surgery 03/06/2021, 7:16 AM

## 2021-03-06 NOTE — Progress Notes (Signed)
Physical Therapy Treatment Patient Details Name: Cindy Harper MRN: [id number] DOB: 11-26-1945 Today's Date: 03/06/2021    History of Present Illness 75yo female s/p R TKA on 03/04/21. PMHx includes CAD, past MI (2005), thyroid disease, GERd, HLD, OA, L TKA.    PT Comments    Pt received seated in recliner. Pt agreeable to PT services. Pt reviewed majority of exercises in HEP with good understanding how to perform on RLE with minimal VC's from PT. Mod-I for transfers noted throughout session and able to amb with RW Mod-I 240'. Reviewed how to safely asc stairs with RW backwards and forwards with desc. Initial PT demo. MinGuard for safety, pt displayed safe technique with asc/desc with this technique 4 stairs with 2 attempts with no LOB. Step to pattern with appropriate sequencing "up with the good, down with the bad". Pt returned to recliner in room with all questions addressed. No further PT needs in current venue of care and d/c recs remain appropriate.      Follow Up Recommendations  Home health PT;Supervision/Assistance - 24 hour     Equipment Recommendations  3in1 (PT)    Recommendations for Other Services       Precautions / Restrictions Precautions Precautions: Fall;Knee Precaution Booklet Issued: Yes (comment) Restrictions Weight Bearing Restrictions: Yes RLE Weight Bearing: Weight bearing as tolerated    Mobility  Bed Mobility               General bed mobility comments: Received and returned to recliner    Transfers Overall transfer level: Modified independent Equipment used: Rolling walker (2 wheeled) Transfers: Sit to/from Stand Sit to Stand: Modified independent (Device/Increase time)            Ambulation/Gait Ambulation/Gait assistance: Modified independent (Device/Increase time) Gait Distance (Feet): 250 Feet Assistive device: Rolling walker (2 wheeled) Gait Pattern/deviations: Step-through pattern;Decreased step length - left;Decreased  stance time - right     General Gait Details: no LOB noted and good gait speed. No heavy reliance of UE's on RW for support.   Stairs Stairs: Yes Stairs assistance: Min guard Stair Management: No rails;Step to pattern;Backwards;With walker Number of Stairs: 4 General stair comments: Reassessed stairs prior to D/c. Safe technique with RW backwards with asc/ forward with RW upon desc.   Wheelchair Mobility    Modified Rankin (Stroke Patients Only)       Balance Overall balance assessment: Needs assistance Sitting-balance support: Feet supported Sitting balance-Leahy Scale: Good     Standing balance support: Bilateral upper extremity supported Standing balance-Leahy Scale: Fair                              Cognition Arousal/Alertness: Awake/alert Behavior During Therapy: WFL for tasks assessed/performed Overall Cognitive Status: Within Functional Limits for tasks assessed                                        Exercises Total Joint Exercises Ankle Circles/Pumps: AROM;Both;10 reps;Seated Quad Sets: AROM;Right;10 reps;Seated Gluteal Sets: AROM;Strengthening;Both;10 reps;Seated Heel Slides: AROM;Strengthening;Right;10 reps (reclined in recliner (long sitting)) Straight Leg Raises: AROM;Right;Strengthening;Seated;5 reps Goniometric ROM: 0-91 degrees Other Exercises Other Exercises: Asc stairs backwards with RW and desc stairs forwards with RW. 4 stairs.    General Comments        Pertinent Vitals/Pain Pain Assessment: 0-10 Pain Score: 3  Pain Location: R knee  Pain Descriptors / Indicators: Guarding;Grimacing;Sore Pain Intervention(s): Limited activity within patient's tolerance;Monitored during session;Repositioned;Ice applied    Home Living Family/patient expects to be discharged to:: Private residence Living Arrangements: Children Available Help at Discharge: Family Type of Home: House Home Access: Ramped entrance   Home  Layout: One level Home Equipment: Environmental consultant - 2 wheels Additional Comments: Pt discharging to son's house where there are no stairs. However when returning home pt has 2 STE with no railings.    Prior Function Level of Independence: Independent          PT Goals (current goals can now be found in the care plan section) Acute Rehab PT Goals Patient Stated Goal: to go home PT Goal Formulation: With patient Time For Goal Achievement: 03/18/21 Potential to Achieve Goals: Good    Frequency    BID      PT Plan      Co-evaluation              AM-PAC PT "6 Clicks" Mobility   Outcome Measure  Help needed turning from your back to your side while in a flat bed without using bedrails?: A Little Help needed moving from lying on your back to sitting on the side of a flat bed without using bedrails?: A Little Help needed moving to and from a bed to a chair (including a wheelchair)?: A Little Help needed standing up from a chair using your arms (e.g., wheelchair or bedside chair)?: None Help needed to walk in hospital room?: A Little Help needed climbing 3-5 steps with a railing? : A Little 6 Click Score: 19    End of Session Equipment Utilized During Treatment: Gait belt Activity Tolerance: Patient tolerated treatment well;No increased pain Patient left: in chair;with chair alarm set;with call bell/phone within reach   PT Visit Diagnosis: Other abnormalities of gait and mobility (R26.89);Muscle weakness (generalized) (M62.81);Difficulty in walking, not elsewhere classified (R26.2);Pain Pain - Right/Left: Right Pain - part of body: Knee     Time: 1478-2956 PT Time Calculation (min) (ACUTE ONLY): 28 min  Charges:  $Therapeutic Exercise: 8-22 mins $Therapeutic Activity: 8-22 mins                    Ethen Bannan M. Fairly IV, PT, DPT Physical Therapist- [facility]  03/06/2021, 10:49 AM

## 2021-03-06 NOTE — Progress Notes (Signed)
DISCHARGE NOTE:  Pt given discharge instructions. Pt verbalized understanding. TED hose on both legs. Extra honeycomb dressing sent with pt. Pt wheeled to car to by staff. Son providing transportation.

## 2021-09-24 DIAGNOSIS — Z96651 Presence of right artificial knee joint: Secondary | ICD-10-CM | POA: Diagnosis not present

## 2021-10-07 ENCOUNTER — Other Ambulatory Visit: Payer: Self-pay | Admitting: Internal Medicine

## 2021-10-07 DIAGNOSIS — Z1231 Encounter for screening mammogram for malignant neoplasm of breast: Secondary | ICD-10-CM

## 2021-10-10 DIAGNOSIS — E039 Hypothyroidism, unspecified: Secondary | ICD-10-CM | POA: Diagnosis not present

## 2021-10-14 DIAGNOSIS — H16221 Keratoconjunctivitis sicca, not specified as Sjogren's, right eye: Secondary | ICD-10-CM | POA: Diagnosis not present

## 2021-12-05 DIAGNOSIS — E782 Mixed hyperlipidemia: Secondary | ICD-10-CM | POA: Diagnosis not present

## 2021-12-05 DIAGNOSIS — I428 Other cardiomyopathies: Secondary | ICD-10-CM | POA: Diagnosis not present

## 2021-12-05 DIAGNOSIS — I2510 Atherosclerotic heart disease of native coronary artery without angina pectoris: Secondary | ICD-10-CM | POA: Diagnosis not present

## 2021-12-05 DIAGNOSIS — E039 Hypothyroidism, unspecified: Secondary | ICD-10-CM | POA: Diagnosis not present

## 2021-12-06 DIAGNOSIS — L821 Other seborrheic keratosis: Secondary | ICD-10-CM | POA: Diagnosis not present

## 2021-12-06 DIAGNOSIS — D2262 Melanocytic nevi of left upper limb, including shoulder: Secondary | ICD-10-CM | POA: Diagnosis not present

## 2021-12-06 DIAGNOSIS — D2272 Melanocytic nevi of left lower limb, including hip: Secondary | ICD-10-CM | POA: Diagnosis not present

## 2021-12-06 DIAGNOSIS — D2271 Melanocytic nevi of right lower limb, including hip: Secondary | ICD-10-CM | POA: Diagnosis not present

## 2021-12-06 DIAGNOSIS — D2261 Melanocytic nevi of right upper limb, including shoulder: Secondary | ICD-10-CM | POA: Diagnosis not present

## 2021-12-06 DIAGNOSIS — D225 Melanocytic nevi of trunk: Secondary | ICD-10-CM | POA: Diagnosis not present

## 2021-12-09 DIAGNOSIS — E039 Hypothyroidism, unspecified: Secondary | ICD-10-CM | POA: Diagnosis not present

## 2021-12-18 DIAGNOSIS — I428 Other cardiomyopathies: Secondary | ICD-10-CM | POA: Diagnosis not present

## 2021-12-18 DIAGNOSIS — I2510 Atherosclerotic heart disease of native coronary artery without angina pectoris: Secondary | ICD-10-CM | POA: Diagnosis not present

## 2021-12-19 ENCOUNTER — Ambulatory Visit
Admission: RE | Admit: 2021-12-19 | Discharge: 2021-12-19 | Disposition: A | Discharge status: Home / Self Care | Payer: No Typology Code available for payment source | Source: Ambulatory Visit | Attending: Internal Medicine | Admitting: Internal Medicine

## 2021-12-19 DIAGNOSIS — Z1231 Encounter for screening mammogram for malignant neoplasm of breast: Secondary | ICD-10-CM | POA: Diagnosis present

## 2022-01-20 DIAGNOSIS — I2510 Atherosclerotic heart disease of native coronary artery without angina pectoris: Secondary | ICD-10-CM | POA: Diagnosis not present

## 2022-01-20 DIAGNOSIS — E782 Mixed hyperlipidemia: Secondary | ICD-10-CM | POA: Diagnosis not present

## 2022-01-20 DIAGNOSIS — I255 Ischemic cardiomyopathy: Secondary | ICD-10-CM | POA: Diagnosis not present

## 2022-02-03 DIAGNOSIS — I2510 Atherosclerotic heart disease of native coronary artery without angina pectoris: Secondary | ICD-10-CM | POA: Diagnosis not present

## 2022-02-03 DIAGNOSIS — I255 Ischemic cardiomyopathy: Secondary | ICD-10-CM | POA: Diagnosis not present

## 2022-02-25 DIAGNOSIS — Z96653 Presence of artificial knee joint, bilateral: Secondary | ICD-10-CM | POA: Diagnosis not present

## 2022-03-14 DIAGNOSIS — E559 Vitamin D deficiency, unspecified: Secondary | ICD-10-CM | POA: Diagnosis not present

## 2022-03-14 DIAGNOSIS — N390 Urinary tract infection, site not specified: Secondary | ICD-10-CM | POA: Diagnosis not present

## 2022-03-14 DIAGNOSIS — E782 Mixed hyperlipidemia: Secondary | ICD-10-CM | POA: Diagnosis not present

## 2022-03-14 DIAGNOSIS — E538 Deficiency of other specified B group vitamins: Secondary | ICD-10-CM | POA: Diagnosis not present

## 2022-03-21 DIAGNOSIS — E782 Mixed hyperlipidemia: Secondary | ICD-10-CM | POA: Diagnosis not present

## 2022-03-21 DIAGNOSIS — E538 Deficiency of other specified B group vitamins: Secondary | ICD-10-CM | POA: Diagnosis not present

## 2022-03-21 DIAGNOSIS — I420 Dilated cardiomyopathy: Secondary | ICD-10-CM | POA: Diagnosis not present

## 2022-03-21 DIAGNOSIS — Z0000 Encounter for general adult medical examination without abnormal findings: Secondary | ICD-10-CM | POA: Diagnosis not present

## 2022-04-07 NOTE — Progress Notes (Unsigned)
Cardiology Office Note  Date:  04/08/2022   ID:  Dylynn, Ketner July 21, 1946, MRN [id number]  PCP:  Danella Penton, MD   Chief Complaint  Patient presents with   New Patient (Initial Visit)    Ref by Dr. Beatrix Fetters to establish care for CAD NSTEMI 2005; treated medically. Medications reviewed by the patient's bottles. Patient c/o shortness of breath with over exertion.     HPI:  [name] is a 76 year old woman with past medical history of CAD (NSTEMI 2005 treated medically at 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 Who presents for new patient evaluation of her  nonischemic cardiomyopathy  Previously followed by [facility] cardiology Reports getting overstressed while at [facility] 2005 leading to shortness of breath episodes, seen in the hospital in [place] for non-STEMI, diagnosed with nonobstructive coronary disease, records not available  Reviewed echocardiograms from 2019 and 2023 detailing ejection fraction 35% Stress test performed 2019 with no significant ischemia, fixed inferior wall defect  Reports that she is active and overall feels relatively well In the garden, not doing much aerobic activity Does errands, house work Retired from [employer] 1 year  Recently started on losartan 12.5 mg daily  EKG personally reviewed by myself on todays visit Sinus bradycardia rate 59 bpm nonspecific T wave abnormality V3 through V5, 3 and aVF  Prior cardiac history reviewed Cath 2019- No evidence of ischemia on ETT by electrocardiogram. EF 50%. Fixed inferior defect consistent with scar with no significant reversible ischemia. Clinical correlation recommended.  Echo 2019- MODERATE LV SYSTOLIC DYSFUNCTION (See above) NORMAL RIGHT VENTRICULAR SYSTOLIC FUNCTION MILD VALVULAR REGURGITATION (See above) NO VALVULAR STENOSIS Moderate global Lv dysfunction with focal distal septal dyskinesis  Echo 12/2021- MODERATE LV SYSTOLIC DYSFUNCTION (See above) WITH  MILD LVH NORMAL RIGHT VENTRICULAR SYSTOLIC FUNCTION MILD VALVULAR REGURGITATION (See above) NO VALVULAR STENOSIS MILD MR EF 35%  2019 Myoview results, which showed an EF of 50%, fixed inferior defect consistent with scar, with no reversible ischemia.  PMH:   has a past medical history of Arthritis, B12 deficiency, CAD (coronary artery disease), DCM (dilated cardiomyopathy) (HCC), Dyspnea, GERD (gastroesophageal reflux disease), HLD (hyperlipidemia), Hypothyroidism, and Non Q wave myocardial infarction (HCC) (2005).  PSH:    Past Surgical History:  Procedure Laterality Date   ABDOMINAL HYSTERECTOMY     CARDIAC CATHETERIZATION N/A 2005   KNEE ARTHROPLASTY Left 07/09/2016   Procedure: COMPUTER ASSISTED TOTAL KNEE ARTHROPLASTY;  Surgeon: Donato Heinz, MD;  Location: [facility] ORS;  Service: Orthopedics;  Laterality: Left;   KNEE ARTHROPLASTY Right 03/04/2021   Procedure: COMPUTER ASSISTED TOTAL KNEE ARTHROPLASTY;  Surgeon: Donato Heinz, MD;  Location: [facility] ORS;  Service: Orthopedics;  Laterality: Right;    Current Outpatient Medications  Medication Sig Dispense Refill   acetaminophen (TYLENOL) 500 MG tablet Take 500-1,000 mg by mouth every 6 (six) hours as needed for mild pain or headache.     aspirin 81 MG chewable tablet Chew 81 mg by mouth daily.     celecoxib (CELEBREX) 200 MG capsule Take 1 capsule (200 mg total) by mouth 2 (two) times daily. 90 capsule 0   Cyanocobalamin (B-12) 3000 MCG CAPS Take 3,000 mcg by mouth every other day.     ibuprofen (ADVIL) 200 MG tablet Take 200 mg by mouth every 6 (six) hours as needed for moderate pain or headache.     levothyroxine (SYNTHROID) 88 MCG tablet Take 88 mcg by mouth daily before breakfast.  Multiple Vitamin (MULTIVITAMIN WITH MINERALS) TABS tablet Take 1 tablet by mouth daily.     enoxaparin (LOVENOX) 40 MG/0.4ML injection Inject 0.4 mLs (40 mg total) into the skin daily for 14 days. (Patient not taking: Reported on 04/08/2022) 5.6 mL 0    levothyroxine (SYNTHROID, LEVOTHROID) 100 MCG tablet Take 100 mcg by mouth daily before breakfast. (Patient not taking: Reported on 04/08/2022)     ondansetron (ZOFRAN-ODT) 4 MG disintegrating tablet Take 1 tablet (4 mg total) by mouth every 8 (eight) hours as needed for nausea or vomiting. (Patient not taking: Reported on 04/08/2022) 10 tablet 0   traMADol (ULTRAM) 50 MG tablet Take 1 tablet (50 mg total) by mouth every 4 (four) hours as needed for moderate pain. (Patient not taking: Reported on 04/08/2022) 30 tablet 0   No current facility-administered medications for this visit.     Allergies:   Ezetimibe-simvastatin   Social History:  The patient  reports that she has never smoked. She has never used smokeless tobacco. She reports that she does not drink alcohol and does not use drugs.   Family History:   family history includes Breast cancer in her maternal aunt; Breast cancer (age of onset: 47) in her mother; Kidney disease in her father.    Review of Systems: Review of Systems  Constitutional: Negative.   HENT: Negative.    Respiratory: Negative.    Cardiovascular: Negative.   Gastrointestinal: Negative.   Musculoskeletal: Negative.   Neurological: Negative.   Psychiatric/Behavioral: Negative.    All other systems reviewed and are negative.    PHYSICAL EXAM: VS:  BP 120/70 (BP Location: Left Arm, Patient Position: Sitting, Cuff Size: Normal)   Pulse (!) 59   Ht 5\' 5"  (1.651 m)   Wt 199 lb (90.3 kg)   SpO2 98%   BMI 33.12 kg/m  , BMI Body mass index is 33.12 kg/m. GEN: Well nourished, well developed, in no acute distress HEENT: normal Neck: no JVD, carotid bruits, or masses Cardiac: RRR; no murmurs, rubs, or gallops,no edema  Respiratory:  clear to auscultation bilaterally, normal work of breathing GI: soft, nontender, nondistended, + BS MS: no deformity or atrophy Skin: warm and dry, no rash Neuro:  Strength and sensation are intact Psych: euthymic mood, full  affect  Recent Labs: No results found for requested labs within last 365 days.    Lipid Panel No results found for: "CHOL", "HDL", "LDLCALC", "TRIG"    Wt Readings from Last 3 Encounters:  04/08/22 199 lb (90.3 kg)  03/04/21 198 lb (89.8 kg)  02/22/21 197 lb 12.8 oz (89.7 kg)       ASSESSMENT AND PLAN:  Problem List Items Addressed This Visit       Cardiology Problems   CAD (coronary artery disease), native coronary artery   Relevant Medications   aspirin 81 MG chewable tablet   Cardiomyopathy (HCC) - Primary   Relevant Medications   aspirin 81 MG chewable tablet    Dilated cardiomyopathy Ejection fraction 35% in 2019, confirmed again to be low April 2023 EF 35% We have discussed echocardiogram findings, medication options available We have suggested she stop her losartan, start Entresto 24/26 mg twice daily BMP in 1 month Additional options for medications include spironolactone, Jardiance/Farxiga We will likely refrain from using beta-blockers given her bradycardia Appears compensated, no significant edema, will hold off on adding diuretic We will recommend cardiac rehab  Coronary disease with stable angina Prior catheterization 2005 nonobstructive disease Stress test 2019 fixed inferior  wall defect, no cardiac catheterization performed at that time Notes indicating per patient request On aspirin, not on beta-blocker secondary to bradycardia, does not appear to be on a statin Suggest cardiac rehab  Hyperlipidemia We will discuss in follow-up, total cholesterol above 200 Should consider starting low-dose statin.     Total encounter time more than 30 minutes  Greater than 50% was spent in counseling and coordination of care with the patient    Signed, Dossie Arbour, M.D., Ph.D. [facility] [place], [place] [phone]

## 2022-04-08 ENCOUNTER — Ambulatory Visit (INDEPENDENT_AMBULATORY_CARE_PROVIDER_SITE_OTHER): Payer: No Typology Code available for payment source | Admitting: Cardiovascular Disease

## 2022-04-08 ENCOUNTER — Encounter: Payer: Self-pay | Admitting: Cardiovascular Disease

## 2022-04-08 VITALS — BP 120/70 | HR 59 | Ht 65.0 in | Wt 199.0 lb

## 2022-04-08 DIAGNOSIS — E782 Mixed hyperlipidemia: Secondary | ICD-10-CM

## 2022-04-08 DIAGNOSIS — I420 Dilated cardiomyopathy: Secondary | ICD-10-CM | POA: Diagnosis not present

## 2022-04-08 DIAGNOSIS — I25118 Atherosclerotic heart disease of native coronary artery with other forms of angina pectoris: Secondary | ICD-10-CM | POA: Diagnosis not present

## 2022-04-08 MED ORDER — ENTRESTO 24-26 MG PO TABS: 1.0000 | ORAL_TABLET | Freq: Two times a day (BID) | ORAL | 3 refills | Status: DC

## 2022-04-08 NOTE — Patient Instructions (Addendum)
You have been referred to: Cardiac rehab   Medication Instructions:  Please STOP the losartan START entresto 24/26 mg twice a day  If you need a refill on your cardiac medications before your next appointment, please call your pharmacy.   Lab work: Your physician recommends that you return for lab work (BMP) in: 1 month  - Please go to the [facility]. You will check in at the front desk to the right as you walk into the atrium. Valet Parking is offered if needed. - No appointment needed. You may go any day between 7 am and 6 pm.    Testing/Procedures: No new testing needed  Follow-Up: At [facility], you and your health needs are our priority.  As part of our continuing mission to provide you with exceptional heart care, we have created designated Provider Care Teams.  These Care Teams include your primary Cardiologist (physician) and Advanced Practice Providers (APPs -  Physician Assistants and Nurse Practitioners) who all work together to provide you with the care you need, when you need it.  You will need a follow up appointment in 3 months  Providers on your designated Care Team:   Nicolasa Ducking, NP Eula Listen, PA-C Cadence Fransico Michael, [place]  COVID-19 Vaccine Information can be found at: [email or web address] For questions related to vaccine distribution or appointments, please email [email or web address] or call [phone].

## 2022-04-09 ENCOUNTER — Other Ambulatory Visit: Payer: Self-pay | Admitting: Emergency Medicine

## 2022-04-09 MED ORDER — ENTRESTO 24-26 MG PO TABS: 1.0000 | ORAL_TABLET | Freq: Two times a day (BID) | ORAL | 3 refills | Status: DC

## 2022-05-07 ENCOUNTER — Encounter: Payer: No Typology Code available for payment source | Attending: Cardiovascular Disease | Admitting: *Deleted

## 2022-05-07 ENCOUNTER — Encounter: Payer: Self-pay | Admitting: *Deleted

## 2022-05-07 DIAGNOSIS — E119 Type 2 diabetes mellitus without complications: Secondary | ICD-10-CM | POA: Insufficient documentation

## 2022-05-07 DIAGNOSIS — Z713 Dietary counseling and surveillance: Secondary | ICD-10-CM | POA: Insufficient documentation

## 2022-05-07 DIAGNOSIS — I5022 Chronic systolic (congestive) heart failure: Secondary | ICD-10-CM | POA: Insufficient documentation

## 2022-05-07 NOTE — Progress Notes (Signed)
Virtual orientation call completed today. shehas an appointment on Date: 05/15/2022  for EP eval and gym Orientation.  Documentation of diagnosis can be found in [facility] Date: 04/08/2022 .

## 2022-05-12 ENCOUNTER — Other Ambulatory Visit
Admission: RE | Admit: 2022-05-12 | Discharge: 2022-05-12 | Disposition: A | Discharge status: Home / Self Care | Payer: No Typology Code available for payment source | Attending: Cardiovascular Disease | Admitting: Cardiovascular Disease

## 2022-05-12 DIAGNOSIS — I420 Dilated cardiomyopathy: Secondary | ICD-10-CM | POA: Diagnosis not present

## 2022-05-12 LAB — BASIC METABOLIC PANEL
Anion gap: 9 (ref 5–15)
BUN: 19 mg/dL (ref 8–23)
CO2: 23 mmol/L (ref 22–32)
Calcium: 9.2 mg/dL (ref 8.9–10.3)
Chloride: 110 mmol/L (ref 98–111)
Creatinine, Ser: 1.02 mg/dL — ABNORMAL HIGH (ref 0.44–1.00)
GFR, Estimated: 56 mL/min — ABNORMAL LOW (ref >60)
Glucose, Bld: 97 mg/dL (ref 70–99)
Potassium: 3.9 mmol/L (ref 3.5–5.1)
Sodium: 142 mmol/L (ref 135–145)

## 2022-05-15 VITALS — Ht 65.5 in | Wt 201.4 lb

## 2022-05-15 DIAGNOSIS — I5022 Chronic systolic (congestive) heart failure: Secondary | ICD-10-CM

## 2022-05-15 DIAGNOSIS — Z713 Dietary counseling and surveillance: Secondary | ICD-10-CM | POA: Diagnosis not present

## 2022-05-15 DIAGNOSIS — E119 Type 2 diabetes mellitus without complications: Secondary | ICD-10-CM | POA: Diagnosis not present

## 2022-05-15 NOTE — Progress Notes (Signed)
Cardiac Individual Treatment Plan  Patient Details  Name: Cindy Harper MRN: [id number] Date of Birth: November 12, 1945 Referring Provider:   Flowsheet Row Cardiac Rehab from 05/15/2022 in [facility] Cardiac and Pulmonary Rehab  Referring Provider Julien Nordmann MD       Initial Encounter Date:  Flowsheet Row Cardiac Rehab from 05/15/2022 in [facility] Cardiac and Pulmonary Rehab  Date 05/15/22       Visit Diagnosis: Systolic heart failure, chronic (HCC)  Patient's Home Medications on Admission:  Current Outpatient Medications:    acetaminophen (TYLENOL) 500 MG tablet, Take 500-1,000 mg by mouth every 6 (six) hours as needed for mild pain or headache., Disp: , Rfl:    aspirin 81 MG chewable tablet, Chew 81 mg by mouth daily., Disp: , Rfl:    celecoxib (CELEBREX) 200 MG capsule, Take 1 capsule (200 mg total) by mouth 2 (two) times daily. (Patient not taking: Reported on 05/07/2022), Disp: 90 capsule, Rfl: 0   Cyanocobalamin (B-12) 3000 MCG CAPS, Take 3,000 mcg by mouth every other day., Disp: , Rfl:    enoxaparin (LOVENOX) 40 MG/0.4ML injection, Inject 0.4 mLs (40 mg total) into the skin daily for 14 days. (Patient not taking: Reported on 04/08/2022), Disp: 5.6 mL, Rfl: 0   ibuprofen (ADVIL) 200 MG tablet, Take 200 mg by mouth every 6 (six) hours as needed for moderate pain or headache. (Patient not taking: Reported on 05/07/2022), Disp: , Rfl:    levothyroxine (SYNTHROID) 88 MCG tablet, Take 88 mcg by mouth daily before breakfast., Disp: , Rfl:    levothyroxine (SYNTHROID, LEVOTHROID) 100 MCG tablet, Take 100 mcg by mouth daily before breakfast. (Patient not taking: Reported on 04/08/2022), Disp: , Rfl:    Multiple Vitamin (MULTIVITAMIN WITH MINERALS) TABS tablet, Take 1 tablet by mouth daily., Disp: , Rfl:    ondansetron (ZOFRAN-ODT) 4 MG disintegrating tablet, Take 1 tablet (4 mg total) by mouth every 8 (eight) hours as needed for nausea or vomiting. (Patient not taking: Reported on 04/08/2022), Disp: 10  tablet, Rfl: 0   sacubitril-valsartan (ENTRESTO) 24-26 MG, Take 1 tablet by mouth 2 (two) times daily., Disp: 180 tablet, Rfl: 3   traMADol (ULTRAM) 50 MG tablet, Take 1 tablet (50 mg total) by mouth every 4 (four) hours as needed for moderate pain. (Patient not taking: Reported on 04/08/2022), Disp: 30 tablet, Rfl: 0  Past Medical History: Past Medical History:  Diagnosis Date   Arthritis    B12 deficiency    CAD (coronary artery disease)    DCM (dilated cardiomyopathy) (HCC)    Dyspnea    GERD (gastroesophageal reflux disease)    HLD (hyperlipidemia)    Hypothyroidism    Non Q wave myocardial infarction (HCC) 2005    Tobacco Use: Social History   Tobacco Use  Smoking Status Never  Smokeless Tobacco Never    Labs: Review Flowsheet        No data to display           Exercise Target Goals: Exercise Program Goal: Individual exercise prescription set using results from initial 6 min walk test and THRR while considering  patient's activity barriers and safety.   Exercise Prescription Goal: Initial exercise prescription builds to 30-45 minutes a day of aerobic activity, 2-3 days per week.  Home exercise guidelines will be given to patient during program as part of exercise prescription that the participant will acknowledge.   Education: Aerobic Exercise: - Group verbal and visual presentation on the components of exercise prescription. Introduces F.I.T.T  principle from ACSM for exercise prescriptions.  Reviews F.I.T.T. principles of aerobic exercise including progression. Written material given at graduation. Flowsheet Row Cardiac Rehab from 05/15/2022 in [facility] Cardiac and Pulmonary Rehab  Education need identified 05/15/22       Education: Resistance Exercise: - Group verbal and visual presentation on the components of exercise prescription. Introduces F.I.T.T principle from ACSM for exercise prescriptions  Reviews F.I.T.T. principles of resistance exercise including  progression. Written material given at graduation.    Education: Exercise & Equipment Safety: - Individual verbal instruction and demonstration of equipment use and safety with use of the equipment. Flowsheet Row Cardiac Rehab from 05/15/2022 in [facility] Cardiac and Pulmonary Rehab  Education need identified 05/15/22  Date 05/15/22  Educator [place]  Instruction Review Code 1- Verbalizes Understanding       Education: Exercise Physiology & General Exercise Guidelines: - Group verbal and written instruction with models to review the exercise physiology of the cardiovascular system and associated critical values. Provides general exercise guidelines with specific guidelines to those with heart or lung disease.    Education: Flexibility, Balance, Mind/Body Relaxation: - Group verbal and visual presentation with interactive activity on the components of exercise prescription. Introduces F.I.T.T principle from ACSM for exercise prescriptions. Reviews F.I.T.T. principles of flexibility and balance exercise training including progression. Also discusses the mind body connection.  Reviews various relaxation techniques to help reduce and manage stress (i.e. Deep breathing, progressive muscle relaxation, and visualization). Balance handout provided to take home. Written material given at graduation.   Activity Barriers & Risk Stratification:  Activity Barriers & Cardiac Risk Stratification - 05/15/22 1114       Activity Barriers & Cardiac Risk Stratification   Activity Barriers Right Knee Replacement;Left Knee Replacement;Deconditioning;Muscular Weakness    Cardiac Risk Stratification High             6 Minute Walk:  6 Minute Walk     Row Name 05/15/22 1114         6 Minute Walk   Phase Initial     Distance 940 feet     Walk Time 6 minutes     # of Rest Breaks 0     MPH 1.78     METS 1.39     RPE 9     Perceived Dyspnea  0     VO2 Peak 4.89     Symptoms No     Resting HR 65 bpm      Resting BP 110/62     Resting Oxygen Saturation  96 %     Exercise Oxygen Saturation  during 6 min walk 96 %     Max Ex. HR 96 bpm     Max Ex. BP 122/60     2 Minute Post BP 104/60              Oxygen Initial Assessment:   Oxygen Re-Evaluation:   Oxygen Discharge (Final Oxygen Re-Evaluation):   Initial Exercise Prescription:  Initial Exercise Prescription - 05/15/22 1100       Date of Initial Exercise RX and Referring Provider   Date 05/15/22    Referring Provider Julien Nordmann MD      Oxygen   Maintain Oxygen Saturation 88% or higher      Recumbant Bike   Level 1    RPM 60    Watts 17    Minutes 15    METs 1.3      NuStep   Level 1  SPM 80    Minutes 15    METs 1.3      T5 Nustep   Level 1    SPM 80    Minutes 15    METs 1.3      Biostep-RELP   Level 1    SPM 50    Minutes 15    METs 1.3      Track   Laps 21    Minutes 15    METs 1.4      Prescription Details   Frequency (times per week) 2    Duration Progress to 30 minutes of continuous aerobic without signs/symptoms of physical distress      Intensity   THRR 40-80% of Max Heartrate 95 - 126    Ratings of Perceived Exertion 11-13    Perceived Dyspnea 0-4      Progression   Progression Continue to progress workloads to maintain intensity without signs/symptoms of physical distress.      Resistance Training   Training Prescription Yes    Weight 3 lb    Reps 10-15             Perform Capillary Blood Glucose checks as needed.  Exercise Prescription Changes:   Exercise Prescription Changes     Row Name 05/15/22 1100             Response to Exercise   Blood Pressure (Admit) 110/62       Blood Pressure (Exercise) 122/60       Blood Pressure (Exit) 104/60       Heart Rate (Admit) 65 bpm       Heart Rate (Exercise) 96 bpm       Heart Rate (Exit) 69 bpm       Oxygen Saturation (Admit) 96 %       Oxygen Saturation (Exercise) 96 %       Oxygen Saturation (Exit) 96 %        Rating of Perceived Exertion (Exercise) 9       Perceived Dyspnea (Exercise) 0       Symptoms none       Comments walk test results                Exercise Comments:   Exercise Goals and Review:   Exercise Goals     Row Name 05/15/22 1117             Exercise Goals   Increase Physical Activity Yes       Intervention Provide advice, education, support and counseling about physical activity/exercise needs.;Develop an individualized exercise prescription for aerobic and resistive training based on initial evaluation findings, risk stratification, comorbidities and participant's personal goals.       Expected Outcomes Short Term: Attend rehab on a regular basis to increase amount of physical activity.;Long Term: Add in home exercise to make exercise part of routine and to increase amount of physical activity.;Long Term: Exercising regularly at least 3-5 days a week.       Increase Strength and Stamina Yes       Intervention Provide advice, education, support and counseling about physical activity/exercise needs.;Develop an individualized exercise prescription for aerobic and resistive training based on initial evaluation findings, risk stratification, comorbidities and participant's personal goals.       Expected Outcomes Short Term: Increase workloads from initial exercise prescription for resistance, speed, and METs.;Short Term: Perform resistance training exercises routinely during rehab and add in resistance training at home;Long Term: Improve  cardiorespiratory fitness, muscular endurance and strength as measured by increased METs and functional capacity ( )       Able to understand and use rate of perceived exertion (RPE) scale Yes       Intervention Provide education and explanation on how to use RPE scale       Expected Outcomes Short Term: Able to use RPE daily in rehab to express subjective intensity level;Long Term:  Able to use RPE to guide intensity level when  exercising independently       Able to understand and use Dyspnea scale Yes       Intervention Provide education and explanation on how to use Dyspnea scale       Expected Outcomes Short Term: Able to use Dyspnea scale daily in rehab to express subjective sense of shortness of breath during exertion;Long Term: Able to use Dyspnea scale to guide intensity level when exercising independently       Knowledge and understanding of Target Heart Rate Range (THRR) Yes       Intervention Provide education and explanation of THRR including how the numbers were predicted and where they are located for reference       Expected Outcomes Short Term: Able to state/look up THRR;Long Term: Able to use THRR to govern intensity when exercising independently;Short Term: Able to use daily as guideline for intensity in rehab       Able to check pulse independently Yes       Intervention Provide education and demonstration on how to check pulse in carotid and radial arteries.;Review the importance of being able to check your own pulse for safety during independent exercise       Expected Outcomes Short Term: Able to explain why pulse checking is important during independent exercise;Long Term: Able to check pulse independently and accurately       Understanding of Exercise Prescription Yes       Intervention Provide education, explanation, and written materials on patient's individual exercise prescription       Expected Outcomes Short Term: Able to explain program exercise prescription;Long Term: Able to explain home exercise prescription to exercise independently                Exercise Goals Re-Evaluation :   Discharge Exercise Prescription (Final Exercise Prescription Changes):  Exercise Prescription Changes - 05/15/22 1100       Response to Exercise   Blood Pressure (Admit) 110/62    Blood Pressure (Exercise) 122/60    Blood Pressure (Exit) 104/60    Heart Rate (Admit) 65 bpm    Heart Rate (Exercise)  96 bpm    Heart Rate (Exit) 69 bpm    Oxygen Saturation (Admit) 96 %    Oxygen Saturation (Exercise) 96 %    Oxygen Saturation (Exit) 96 %    Rating of Perceived Exertion (Exercise) 9    Perceived Dyspnea (Exercise) 0    Symptoms none    Comments walk test results             Nutrition:  Target Goals: Understanding of nutrition guidelines, daily intake of sodium 1500mg , cholesterol 200mg , calories 30% from fat and 7% or less from saturated fats, daily to have 5 or more servings of fruits and vegetables.  Education: All About Nutrition: -Group instruction provided by verbal, written material, interactive activities, discussions, models, and posters to present general guidelines for heart healthy nutrition including fat, fiber, MyPlate, the role of sodium in heart healthy nutrition, utilization of the  nutrition label, and utilization of this knowledge for meal planning. Follow up email sent as well. Written material given at graduation. Flowsheet Row Cardiac Rehab from 05/15/2022 in [facility] Cardiac and Pulmonary Rehab  Education need identified 05/15/22       Biometrics:  Pre Biometrics - 05/15/22 1112       Pre Biometrics   Height 5' 5.5" (1.664 m)    Weight 201 lb 6.4 oz (91.4 kg)    Waist Circumference 43.5 inches    Hip Circumference 49 inches    Waist to Hip Ratio 0.89 %    BMI (Calculated) 32.99    Single Leg Stand 6.83 seconds              Nutrition Therapy Plan and Nutrition Goals:  Nutrition Therapy & Goals - 05/15/22 1058       Intervention Plan   Intervention Prescribe, educate and counsel regarding individualized specific dietary modifications aiming towards targeted core components such as weight, hypertension, lipid management, diabetes, heart failure and other comorbidities.    Expected Outcomes Short Term Goal: Understand basic principles of dietary content, such as calories, fat, sodium, cholesterol and nutrients.;Short Term Goal: A plan has been  developed with personal nutrition goals set during dietitian appointment.;Long Term Goal: Adherence to prescribed nutrition plan.             Nutrition Assessments:  MEDIFICTS Score Key: ?70 Need to make dietary changes  40-70 Heart Healthy Diet ? 40 Therapeutic Level Cholesterol Diet  Flowsheet Row Cardiac Rehab from 05/15/2022 in [facility] Cardiac and Pulmonary Rehab  Picture Your Plate Total Score on Admission 55      Picture Your Plate Scores: <93 Unhealthy dietary pattern with much room for improvement. 41-50 Dietary pattern unlikely to meet recommendations for good health and room for improvement. 51-60 More healthful dietary pattern, with some room for improvement.  >60 Healthy dietary pattern, although there may be some specific behaviors that could be improved.    Nutrition Goals Re-Evaluation:   Nutrition Goals Discharge (Final Nutrition Goals Re-Evaluation):   Psychosocial: Target Goals: Acknowledge presence or absence of significant depression and/or stress, maximize coping skills, provide positive support system. Participant is able to verbalize types and ability to use techniques and skills needed for reducing stress and depression.   Education: Stress, Anxiety, and Depression - Group verbal and visual presentation to define topics covered.  Reviews how body is impacted by stress, anxiety, and depression.  Also discusses healthy ways to reduce stress and to treat/manage anxiety and depression.  Written material given at graduation.   Education: Sleep Hygiene -Provides group verbal and written instruction about how sleep can affect your health.  Define sleep hygiene, discuss sleep cycles and impact of sleep habits. Review good sleep hygiene tips.    Initial Review & Psychosocial Screening:  Initial Psych Review & Screening - 05/07/22 1416       Initial Review   Current issues with None Identified      Family Dynamics   Good Support System? Yes   one son,his  wife and the grandchildren. 2 sisters     Barriers   Psychosocial barriers to participate in program There are no identifiable barriers or psychosocial needs.      Screening Interventions   Interventions Encouraged to exercise;To provide support and resources with identified psychosocial needs;Provide feedback about the scores to participant    Expected Outcomes Short Term goal: Utilizing psychosocial counselor, staff and physician to assist with identification of specific Stressors  or current issues interfering with healing process. Setting desired goal for each stressor or current issue identified.;Long Term Goal: Stressors or current issues are controlled or eliminated.;Short Term goal: Identification and review with participant of any Quality of Life or Depression concerns found by scoring the questionnaire.;Long Term goal: The participant improves quality of Life and PHQ9 Scores as seen by post scores and/or verbalization of changes             Quality of Life Scores:   Quality of Life - 05/15/22 1055       Quality of Life   Select Quality of Life      Quality of Life Scores   Health/Function Pre 20.54 %    Socioeconomic Pre 23.43 %    Psych/Spiritual Pre 23.57 %    Family Pre 25.5 %    GLOBAL Pre 22.52 %            Scores of 19 and below usually indicate a poorer quality of life in these areas.  A difference of  2-3 points is a clinically meaningful difference.  A difference of 2-3 points in the total score of the Quality of Life Index has been associated with significant improvement in overall quality of life, self-image, physical symptoms, and general health in studies assessing change in quality of life.  PHQ-9: Review Flowsheet       05/15/2022  Depression screen PHQ 2/9  Decreased Interest 1  Down, Depressed, Hopeless 0  PHQ - 2 Score 1  Altered sleeping 0  Tired, decreased energy 1  Change in appetite 1  Feeling bad or failure about yourself  0  Trouble  concentrating 0  Moving slowly or fidgety/restless 0  Suicidal thoughts 0  PHQ-9 Score 3  Difficult doing work/chores Somewhat difficult   Interpretation of Total Score  Total Score Depression Severity:  1-4 = Minimal depression, 5-9 = Mild depression, 10-14 = Moderate depression, 15-19 = Moderately severe depression, 20-27 = Severe depression   Psychosocial Evaluation and Intervention:  Psychosocial Evaluation - 05/07/22 1428       Psychosocial Evaluation & Interventions   Interventions Encouraged to exercise with the program and follow exercise prescription    Comments Grisel has no barriers to attending the program.  [facility] is ready to get started. She lives alone,has been widowed for many years. SHe has a son,his wife and the grandchildren for support. She is ready to get started.    Expected Outcomes STG Saman attends all scheduled sessions, she is able to manage the entire exercise session.     LTG Janasia will continue to progress her exercise regimen.    Continue Psychosocial Services  Follow up required by staff             Psychosocial Re-Evaluation:   Psychosocial Discharge (Final Psychosocial Re-Evaluation):   Vocational Rehabilitation: Provide vocational rehab assistance to qualifying candidates.   Vocational Rehab Evaluation & Intervention:   Education: Education Goals: Education classes will be provided on a variety of topics geared toward better understanding of heart health and risk factor modification. Participant will state understanding/return demonstration of topics presented as noted by education test scores.  Learning Barriers/Preferences:   General Cardiac Education Topics:  AED/CPR: - Group verbal and written instruction with the use of models to demonstrate the basic use of the AED with the basic ABC's of resuscitation.   Anatomy and Cardiac Procedures: - Group verbal and visual presentation and models provide information about basic cardiac  anatomy and  function. Reviews the testing methods done to diagnose heart disease and the outcomes of the test results. Describes the treatment choices: Medical Management, Angioplasty, or Coronary Bypass Surgery for treating various heart conditions including Myocardial Infarction, Angina, Valve Disease, and Cardiac Arrhythmias.  Written material given at graduation.   Medication Safety: - Group verbal and visual instruction to review commonly prescribed medications for heart and lung disease. Reviews the medication, class of the drug, and side effects. Includes the steps to properly store meds and maintain the prescription regimen.  Written material given at graduation.   Intimacy: - Group verbal instruction through game format to discuss how heart and lung disease can affect sexual intimacy. Written material given at graduation..   Know Your Numbers and Heart Failure: - Group verbal and visual instruction to discuss disease risk factors for cardiac and pulmonary disease and treatment options.  Reviews associated critical values for Overweight/Obesity, Hypertension, Cholesterol, and Diabetes.  Discusses basics of heart failure: signs/symptoms and treatments.  Introduces Heart Failure Zone chart for action plan for heart failure.  Written material given at graduation.   Infection Prevention: - Provides verbal and written material to individual with discussion of infection control including proper hand washing and proper equipment cleaning during exercise session. Flowsheet Row Cardiac Rehab from 05/15/2022 in [facility] Cardiac and Pulmonary Rehab  Education need identified 05/15/22  Date 05/15/22  Educator [place]  Instruction Review Code 1- Verbalizes Understanding       Falls Prevention: - Provides verbal and written material to individual with discussion of falls prevention and safety. Flowsheet Row Cardiac Rehab from 05/15/2022 in [facility] Cardiac and Pulmonary Rehab  Education need identified  05/15/22  Date 05/15/22  Educator [place]  Instruction Review Code 1- Verbalizes Understanding       Other: -Provides group and verbal instruction on various topics (see comments)   Knowledge Questionnaire Score:  Knowledge Questionnaire Score - 05/15/22 1054       Knowledge Questionnaire Score   Pre Score 24/26             Core Components/Risk Factors/Patient Goals at Admission:  Personal Goals and Risk Factors at Admission - 05/15/22 1118       Core Components/Risk Factors/Patient Goals on Admission    Weight Management Yes;Weight Loss    Intervention Weight Management: Develop a combined nutrition and exercise program designed to reach desired caloric intake, while maintaining appropriate intake of nutrient and fiber, sodium and fats, and appropriate energy expenditure required for the weight goal.;Weight Management: Provide education and appropriate resources to help participant work on and attain dietary goals.    Admit Weight 201 lb (91.2 kg)    Goal Weight: Short Term 196 lb (88.9 kg)    Goal Weight: Long Term 185 lb (83.9 kg)    Expected Outcomes Short Term: Continue to assess and modify interventions until short term weight is achieved;Long Term: Adherence to nutrition and physical activity/exercise program aimed toward attainment of established weight goal;Weight Loss: Understanding of general recommendations for a balanced deficit meal plan, which promotes 1-2 lb weight loss per week and includes a negative energy balance of [phone] kcal/d;Understanding recommendations for meals to include 15-35% energy as protein, 25-35% energy from fat, 35-60% energy from carbohydrates, less than 200mg  of dietary cholesterol, 20-35 gm of total fiber daily;Understanding of distribution of calorie intake throughout the day with the consumption of 4-5 meals/snacks    Heart Failure Yes    Intervention Provide a combined exercise and nutrition program that is supplemented  with education,  support and counseling about heart failure. Directed toward relieving symptoms such as shortness of breath, decreased exercise tolerance, and extremity edema.    Expected Outcomes Improve functional capacity of life;Short term: Attendance in program 2-3 days a week with increased exercise capacity. Reported lower sodium intake. Reported increased fruit and vegetable intake. Reports medication compliance.;Short term: Daily weights obtained and reported for increase. Utilizing diuretic protocols set by physician.;Long term: Adoption of self-care skills and reduction of barriers for early signs and symptoms recognition and intervention leading to self-care maintenance.    Lipids Yes    Intervention Provide education and support for participant on nutrition & aerobic/resistive exercise along with prescribed medications to achieve LDL 70mg , HDL >40mg .    Expected Outcomes Short Term: Participant states understanding of desired cholesterol values and is compliant with medications prescribed. Participant is following exercise prescription and nutrition guidelines.;Long Term: Cholesterol controlled with medications as prescribed, with individualized exercise RX and with personalized nutrition plan. Value goals: LDL < 70mg , HDL > 40 mg.             Education:Diabetes - Individual verbal and written instruction to review signs/symptoms of diabetes, desired ranges of glucose level fasting, after meals and with exercise. Acknowledge that pre and post exercise glucose checks will be done for 3 sessions at entry of program.   Core Components/Risk Factors/Patient Goals Review:    Core Components/Risk Factors/Patient Goals at Discharge (Final Review):    ITP Comments:  ITP Comments     Row Name 05/07/22 1427 05/15/22 1052         ITP Comments Virtual orientation call completed today. shehas an appointment on Date: 05/15/2022  for EP eval and gym Orientation.  Documentation of diagnosis can be found in  [facility] Date: 04/08/2022 . Completed 04/10/2022 and gym orientation. Initial ITP created and sent for review to Dr. , Medical Director.               Comments: Initial ITP

## 2022-05-15 NOTE — Patient Instructions (Signed)
Patient Instructions  Patient Details  Name: Cindy Harper MRN: [id number] Date of Birth: [birth year] Referring Provider:  Antonieta Iba, MD  Below are your personal goals for exercise, nutrition, and risk factors. Our goal is to help you stay on track towards obtaining and maintaining these goals. We will be discussing your progress on these goals with you throughout the program.  Initial Exercise Prescription:  Initial Exercise Prescription - 05/15/22 1100       Date of Initial Exercise RX and Referring Provider   Date 05/15/22    Referring Provider Julien Nordmann MD      Oxygen   Maintain Oxygen Saturation 88% or higher      Recumbant Bike   Level 1    RPM 60    Watts 17    Minutes 15    METs 1.3      NuStep   Level 1    SPM 80    Minutes 15    METs 1.3      T5 Nustep   Level 1    SPM 80    Minutes 15    METs 1.3      Biostep-RELP   Level 1    SPM 50    Minutes 15    METs 1.3      Track   Laps 21    Minutes 15    METs 1.4      Prescription Details   Frequency (times per week) 2    Duration Progress to 30 minutes of continuous aerobic without signs/symptoms of physical distress      Intensity   THRR 40-80% of Max Heartrate 95 - 126    Ratings of Perceived Exertion 11-13    Perceived Dyspnea 0-4      Progression   Progression Continue to progress workloads to maintain intensity without signs/symptoms of physical distress.      Resistance Training   Training Prescription Yes    Weight 3 lb    Reps 10-15             Exercise Goals: Frequency: Be able to perform aerobic exercise two to three times per week in program working toward 2-5 days per week of home exercise.  Intensity: Work with a perceived exertion of 11 (fairly light) - 15 (hard) while following your exercise prescription.  We will make changes to your prescription with you as you progress through the program.   Duration: Be able to do 30 to 45 minutes of continuous aerobic  exercise in addition to a 5 minute warm-up and a 5 minute cool-down routine.   Nutrition Goals: Your personal nutrition goals will be established when you do your nutrition analysis with the dietician.  The following are general nutrition guidelines to follow: Cholesterol < 200mg /day Sodium < 1500mg /day Fiber: Women over 50 yrs - 21 grams per day  Personal Goals:  Personal Goals and Risk Factors at Admission - 05/15/22 1118       Core Components/Risk Factors/Patient Goals on Admission    Weight Management Yes;Weight Loss    Intervention Weight Management: Develop a combined nutrition and exercise program designed to reach desired caloric intake, while maintaining appropriate intake of nutrient and fiber, sodium and fats, and appropriate energy expenditure required for the weight goal.;Weight Management: Provide education and appropriate resources to help participant work on and attain dietary goals.    Admit Weight 201 lb (91.2 kg)    Goal Weight: Short Term 196 lb (88.9 kg)  Goal Weight: Long Term 185 lb (83.9 kg)    Expected Outcomes Short Term: Continue to assess and modify interventions until short term weight is achieved;Long Term: Adherence to nutrition and physical activity/exercise program aimed toward attainment of established weight goal;Weight Loss: Understanding of general recommendations for a balanced deficit meal plan, which promotes 1-2 lb weight loss per week and includes a negative energy balance of [phone] kcal/d;Understanding recommendations for meals to include 15-35% energy as protein, 25-35% energy from fat, 35-60% energy from carbohydrates, less than 200mg  of dietary cholesterol, 20-35 gm of total fiber daily;Understanding of distribution of calorie intake throughout the day with the consumption of 4-5 meals/snacks    Heart Failure Yes    Intervention Provide a combined exercise and nutrition program that is supplemented with education, support and counseling about  heart failure. Directed toward relieving symptoms such as shortness of breath, decreased exercise tolerance, and extremity edema.    Expected Outcomes Improve functional capacity of life;Short term: Attendance in program 2-3 days a week with increased exercise capacity. Reported lower sodium intake. Reported increased fruit and vegetable intake. Reports medication compliance.;Short term: Daily weights obtained and reported for increase. Utilizing diuretic protocols set by physician.;Long term: Adoption of self-care skills and reduction of barriers for early signs and symptoms recognition and intervention leading to self-care maintenance.    Lipids Yes    Intervention Provide education and support for participant on nutrition & aerobic/resistive exercise along with prescribed medications to achieve LDL 70mg , HDL >40mg .    Expected Outcomes Short Term: Participant states understanding of desired cholesterol values and is compliant with medications prescribed. Participant is following exercise prescription and nutrition guidelines.;Long Term: Cholesterol controlled with medications as prescribed, with individualized exercise RX and with personalized nutrition plan. Value goals: LDL < 70mg , HDL > 40 mg.             Tobacco Use Initial Evaluation: Social History   Tobacco Use  Smoking Status Never  Smokeless Tobacco Never    Exercise Goals and Review:  Exercise Goals     Row Name 05/15/22 1117             Exercise Goals   Increase Physical Activity Yes       Intervention Provide advice, education, support and counseling about physical activity/exercise needs.;Develop an individualized exercise prescription for aerobic and resistive training based on initial evaluation findings, risk stratification, comorbidities and participant's personal goals.       Expected Outcomes Short Term: Attend rehab on a regular basis to increase amount of physical activity.;Long Term: Add in home exercise to  make exercise part of routine and to increase amount of physical activity.;Long Term: Exercising regularly at least 3-5 days a week.       Increase Strength and Stamina Yes       Intervention Provide advice, education, support and counseling about physical activity/exercise needs.;Develop an individualized exercise prescription for aerobic and resistive training based on initial evaluation findings, risk stratification, comorbidities and participant's personal goals.       Expected Outcomes Short Term: Increase workloads from initial exercise prescription for resistance, speed, and METs.;Short Term: Perform resistance training exercises routinely during rehab and add in resistance training at home;Long Term: Improve cardiorespiratory fitness, muscular endurance and strength as measured by increased METs and functional capacity ( )       Able to understand and use rate of perceived exertion (RPE) scale Yes       Intervention Provide education and explanation on how to  use RPE scale       Expected Outcomes Short Term: Able to use RPE daily in rehab to express subjective intensity level;Long Term:  Able to use RPE to guide intensity level when exercising independently       Able to understand and use Dyspnea scale Yes       Intervention Provide education and explanation on how to use Dyspnea scale       Expected Outcomes Short Term: Able to use Dyspnea scale daily in rehab to express subjective sense of shortness of breath during exertion;Long Term: Able to use Dyspnea scale to guide intensity level when exercising independently       Knowledge and understanding of Target Heart Rate Range (THRR) Yes       Intervention Provide education and explanation of THRR including how the numbers were predicted and where they are located for reference       Expected Outcomes Short Term: Able to state/look up THRR;Long Term: Able to use THRR to govern intensity when exercising independently;Short Term: Able to use  daily as guideline for intensity in rehab       Able to check pulse independently Yes       Intervention Provide education and demonstration on how to check pulse in carotid and radial arteries.;Review the importance of being able to check your own pulse for safety during independent exercise       Expected Outcomes Short Term: Able to explain why pulse checking is important during independent exercise;Long Term: Able to check pulse independently and accurately       Understanding of Exercise Prescription Yes       Intervention Provide education, explanation, and written materials on patient's individual exercise prescription       Expected Outcomes Short Term: Able to explain program exercise prescription;Long Term: Able to explain home exercise prescription to exercise independently                Copy of goals given to participant.

## 2022-05-21 ENCOUNTER — Ambulatory Visit: Payer: No Typology Code available for payment source

## 2022-05-21 ENCOUNTER — Encounter: Payer: Self-pay | Admitting: *Deleted

## 2022-05-21 DIAGNOSIS — I5022 Chronic systolic (congestive) heart failure: Secondary | ICD-10-CM

## 2022-05-21 NOTE — Progress Notes (Signed)
Cardiac Individual Treatment Plan  Patient Details  Name: Cindy Harper MRN: [id number] Date of Birth: November 12, 1945 Referring Provider:   Flowsheet Row Cardiac Rehab from 05/15/2022 in [facility] Cardiac and Pulmonary Rehab  Referring Provider Julien Nordmann MD       Initial Encounter Date:  Flowsheet Row Cardiac Rehab from 05/15/2022 in [facility] Cardiac and Pulmonary Rehab  Date 05/15/22       Visit Diagnosis: Systolic heart failure, chronic (HCC)  Patient's Home Medications on Admission:  Current Outpatient Medications:    acetaminophen (TYLENOL) 500 MG tablet, Take 500-1,000 mg by mouth every 6 (six) hours as needed for mild pain or headache., Disp: , Rfl:    aspirin 81 MG chewable tablet, Chew 81 mg by mouth daily., Disp: , Rfl:    celecoxib (CELEBREX) 200 MG capsule, Take 1 capsule (200 mg total) by mouth 2 (two) times daily. (Patient not taking: Reported on 05/07/2022), Disp: 90 capsule, Rfl: 0   Cyanocobalamin (B-12) 3000 MCG CAPS, Take 3,000 mcg by mouth every other day., Disp: , Rfl:    enoxaparin (LOVENOX) 40 MG/0.4ML injection, Inject 0.4 mLs (40 mg total) into the skin daily for 14 days. (Patient not taking: Reported on 04/08/2022), Disp: 5.6 mL, Rfl: 0   ibuprofen (ADVIL) 200 MG tablet, Take 200 mg by mouth every 6 (six) hours as needed for moderate pain or headache. (Patient not taking: Reported on 05/07/2022), Disp: , Rfl:    levothyroxine (SYNTHROID) 88 MCG tablet, Take 88 mcg by mouth daily before breakfast., Disp: , Rfl:    levothyroxine (SYNTHROID, LEVOTHROID) 100 MCG tablet, Take 100 mcg by mouth daily before breakfast. (Patient not taking: Reported on 04/08/2022), Disp: , Rfl:    Multiple Vitamin (MULTIVITAMIN WITH MINERALS) TABS tablet, Take 1 tablet by mouth daily., Disp: , Rfl:    ondansetron (ZOFRAN-ODT) 4 MG disintegrating tablet, Take 1 tablet (4 mg total) by mouth every 8 (eight) hours as needed for nausea or vomiting. (Patient not taking: Reported on 04/08/2022), Disp: 10  tablet, Rfl: 0   sacubitril-valsartan (ENTRESTO) 24-26 MG, Take 1 tablet by mouth 2 (two) times daily., Disp: 180 tablet, Rfl: 3   traMADol (ULTRAM) 50 MG tablet, Take 1 tablet (50 mg total) by mouth every 4 (four) hours as needed for moderate pain. (Patient not taking: Reported on 04/08/2022), Disp: 30 tablet, Rfl: 0  Past Medical History: Past Medical History:  Diagnosis Date   Arthritis    B12 deficiency    CAD (coronary artery disease)    DCM (dilated cardiomyopathy) (HCC)    Dyspnea    GERD (gastroesophageal reflux disease)    HLD (hyperlipidemia)    Hypothyroidism    Non Q wave myocardial infarction (HCC) 2005    Tobacco Use: Social History   Tobacco Use  Smoking Status Never  Smokeless Tobacco Never    Labs: Review Flowsheet        No data to display           Exercise Target Goals: Exercise Program Goal: Individual exercise prescription set using results from initial 6 min walk test and THRR while considering  patient's activity barriers and safety.   Exercise Prescription Goal: Initial exercise prescription builds to 30-45 minutes a day of aerobic activity, 2-3 days per week.  Home exercise guidelines will be given to patient during program as part of exercise prescription that the participant will acknowledge.   Education: Aerobic Exercise: - Group verbal and visual presentation on the components of exercise prescription. Introduces F.I.T.T  principle from ACSM for exercise prescriptions.  Reviews F.I.T.T. principles of aerobic exercise including progression. Written material given at graduation. Flowsheet Row Cardiac Rehab from 05/15/2022 in [facility] Cardiac and Pulmonary Rehab  Education need identified 05/15/22       Education: Resistance Exercise: - Group verbal and visual presentation on the components of exercise prescription. Introduces F.I.T.T principle from ACSM for exercise prescriptions  Reviews F.I.T.T. principles of resistance exercise including  progression. Written material given at graduation.    Education: Exercise & Equipment Safety: - Individual verbal instruction and demonstration of equipment use and safety with use of the equipment. Flowsheet Row Cardiac Rehab from 05/15/2022 in [facility] Cardiac and Pulmonary Rehab  Education need identified 05/15/22  Date 05/15/22  Educator [place]  Instruction Review Code 1- Verbalizes Understanding       Education: Exercise Physiology & General Exercise Guidelines: - Group verbal and written instruction with models to review the exercise physiology of the cardiovascular system and associated critical values. Provides general exercise guidelines with specific guidelines to those with heart or lung disease.    Education: Flexibility, Balance, Mind/Body Relaxation: - Group verbal and visual presentation with interactive activity on the components of exercise prescription. Introduces F.I.T.T principle from ACSM for exercise prescriptions. Reviews F.I.T.T. principles of flexibility and balance exercise training including progression. Also discusses the mind body connection.  Reviews various relaxation techniques to help reduce and manage stress (i.e. Deep breathing, progressive muscle relaxation, and visualization). Balance handout provided to take home. Written material given at graduation.   Activity Barriers & Risk Stratification:  Activity Barriers & Cardiac Risk Stratification - 05/15/22 1114       Activity Barriers & Cardiac Risk Stratification   Activity Barriers Right Knee Replacement;Left Knee Replacement;Deconditioning;Muscular Weakness    Cardiac Risk Stratification High             6 Minute Walk:  6 Minute Walk     Row Name 05/15/22 1114         6 Minute Walk   Phase Initial     Distance 940 feet     Walk Time 6 minutes     # of Rest Breaks 0     MPH 1.78     METS 1.39     RPE 9     Perceived Dyspnea  0     VO2 Peak 4.89     Symptoms No     Resting HR 65 bpm      Resting BP 110/62     Resting Oxygen Saturation  96 %     Exercise Oxygen Saturation  during 6 min walk 96 %     Max Ex. HR 96 bpm     Max Ex. BP 122/60     2 Minute Post BP 104/60              Oxygen Initial Assessment:   Oxygen Re-Evaluation:   Oxygen Discharge (Final Oxygen Re-Evaluation):   Initial Exercise Prescription:  Initial Exercise Prescription - 05/15/22 1100       Date of Initial Exercise RX and Referring Provider   Date 05/15/22    Referring Provider Julien Nordmann MD      Oxygen   Maintain Oxygen Saturation 88% or higher      Recumbant Bike   Level 1    RPM 60    Watts 17    Minutes 15    METs 1.3      NuStep   Level 1  SPM 80    Minutes 15    METs 1.3      T5 Nustep   Level 1    SPM 80    Minutes 15    METs 1.3      Biostep-RELP   Level 1    SPM 50    Minutes 15    METs 1.3      Track   Laps 21    Minutes 15    METs 1.4      Prescription Details   Frequency (times per week) 2    Duration Progress to 30 minutes of continuous aerobic without signs/symptoms of physical distress      Intensity   THRR 40-80% of Max Heartrate 95 - 126    Ratings of Perceived Exertion 11-13    Perceived Dyspnea 0-4      Progression   Progression Continue to progress workloads to maintain intensity without signs/symptoms of physical distress.      Resistance Training   Training Prescription Yes    Weight 3 lb    Reps 10-15             Perform Capillary Blood Glucose checks as needed.  Exercise Prescription Changes:   Exercise Prescription Changes     Row Name 05/15/22 1100             Response to Exercise   Blood Pressure (Admit) 110/62       Blood Pressure (Exercise) 122/60       Blood Pressure (Exit) 104/60       Heart Rate (Admit) 65 bpm       Heart Rate (Exercise) 96 bpm       Heart Rate (Exit) 69 bpm       Oxygen Saturation (Admit) 96 %       Oxygen Saturation (Exercise) 96 %       Oxygen Saturation (Exit) 96 %        Rating of Perceived Exertion (Exercise) 9       Perceived Dyspnea (Exercise) 0       Symptoms none       Comments walk test results                Exercise Comments:   Exercise Goals and Review:   Exercise Goals     Row Name 05/15/22 1117             Exercise Goals   Increase Physical Activity Yes       Intervention Provide advice, education, support and counseling about physical activity/exercise needs.;Develop an individualized exercise prescription for aerobic and resistive training based on initial evaluation findings, risk stratification, comorbidities and participant's personal goals.       Expected Outcomes Short Term: Attend rehab on a regular basis to increase amount of physical activity.;Long Term: Add in home exercise to make exercise part of routine and to increase amount of physical activity.;Long Term: Exercising regularly at least 3-5 days a week.       Increase Strength and Stamina Yes       Intervention Provide advice, education, support and counseling about physical activity/exercise needs.;Develop an individualized exercise prescription for aerobic and resistive training based on initial evaluation findings, risk stratification, comorbidities and participant's personal goals.       Expected Outcomes Short Term: Increase workloads from initial exercise prescription for resistance, speed, and METs.;Short Term: Perform resistance training exercises routinely during rehab and add in resistance training at home;Long Term: Improve  cardiorespiratory fitness, muscular endurance and strength as measured by increased METs and functional capacity ( )       Able to understand and use rate of perceived exertion (RPE) scale Yes       Intervention Provide education and explanation on how to use RPE scale       Expected Outcomes Short Term: Able to use RPE daily in rehab to express subjective intensity level;Long Term:  Able to use RPE to guide intensity level when  exercising independently       Able to understand and use Dyspnea scale Yes       Intervention Provide education and explanation on how to use Dyspnea scale       Expected Outcomes Short Term: Able to use Dyspnea scale daily in rehab to express subjective sense of shortness of breath during exertion;Long Term: Able to use Dyspnea scale to guide intensity level when exercising independently       Knowledge and understanding of Target Heart Rate Range (THRR) Yes       Intervention Provide education and explanation of THRR including how the numbers were predicted and where they are located for reference       Expected Outcomes Short Term: Able to state/look up THRR;Long Term: Able to use THRR to govern intensity when exercising independently;Short Term: Able to use daily as guideline for intensity in rehab       Able to check pulse independently Yes       Intervention Provide education and demonstration on how to check pulse in carotid and radial arteries.;Review the importance of being able to check your own pulse for safety during independent exercise       Expected Outcomes Short Term: Able to explain why pulse checking is important during independent exercise;Long Term: Able to check pulse independently and accurately       Understanding of Exercise Prescription Yes       Intervention Provide education, explanation, and written materials on patient's individual exercise prescription       Expected Outcomes Short Term: Able to explain program exercise prescription;Long Term: Able to explain home exercise prescription to exercise independently                Exercise Goals Re-Evaluation :   Discharge Exercise Prescription (Final Exercise Prescription Changes):  Exercise Prescription Changes - 05/15/22 1100       Response to Exercise   Blood Pressure (Admit) 110/62    Blood Pressure (Exercise) 122/60    Blood Pressure (Exit) 104/60    Heart Rate (Admit) 65 bpm    Heart Rate (Exercise)  96 bpm    Heart Rate (Exit) 69 bpm    Oxygen Saturation (Admit) 96 %    Oxygen Saturation (Exercise) 96 %    Oxygen Saturation (Exit) 96 %    Rating of Perceived Exertion (Exercise) 9    Perceived Dyspnea (Exercise) 0    Symptoms none    Comments walk test results             Nutrition:  Target Goals: Understanding of nutrition guidelines, daily intake of sodium 1500mg , cholesterol 200mg , calories 30% from fat and 7% or less from saturated fats, daily to have 5 or more servings of fruits and vegetables.  Education: All About Nutrition: -Group instruction provided by verbal, written material, interactive activities, discussions, models, and posters to present general guidelines for heart healthy nutrition including fat, fiber, MyPlate, the role of sodium in heart healthy nutrition, utilization of the  nutrition label, and utilization of this knowledge for meal planning. Follow up email sent as well. Written material given at graduation. Flowsheet Row Cardiac Rehab from 05/15/2022 in [facility] Cardiac and Pulmonary Rehab  Education need identified 05/15/22       Biometrics:  Pre Biometrics - 05/15/22 1112       Pre Biometrics   Height 5' 5.5" (1.664 m)    Weight 201 lb 6.4 oz (91.4 kg)    Waist Circumference 43.5 inches    Hip Circumference 49 inches    Waist to Hip Ratio 0.89 %    BMI (Calculated) 32.99    Single Leg Stand 6.83 seconds              Nutrition Therapy Plan and Nutrition Goals:  Nutrition Therapy & Goals - 05/15/22 1058       Intervention Plan   Intervention Prescribe, educate and counsel regarding individualized specific dietary modifications aiming towards targeted core components such as weight, hypertension, lipid management, diabetes, heart failure and other comorbidities.    Expected Outcomes Short Term Goal: Understand basic principles of dietary content, such as calories, fat, sodium, cholesterol and nutrients.;Short Term Goal: A plan has been  developed with personal nutrition goals set during dietitian appointment.;Long Term Goal: Adherence to prescribed nutrition plan.             Nutrition Assessments:  MEDIFICTS Score Key: ?70 Need to make dietary changes  40-70 Heart Healthy Diet ? 40 Therapeutic Level Cholesterol Diet  Flowsheet Row Cardiac Rehab from 05/15/2022 in [facility] Cardiac and Pulmonary Rehab  Picture Your Plate Total Score on Admission 55      Picture Your Plate Scores: <93 Unhealthy dietary pattern with much room for improvement. 41-50 Dietary pattern unlikely to meet recommendations for good health and room for improvement. 51-60 More healthful dietary pattern, with some room for improvement.  >60 Healthy dietary pattern, although there may be some specific behaviors that could be improved.    Nutrition Goals Re-Evaluation:   Nutrition Goals Discharge (Final Nutrition Goals Re-Evaluation):   Psychosocial: Target Goals: Acknowledge presence or absence of significant depression and/or stress, maximize coping skills, provide positive support system. Participant is able to verbalize types and ability to use techniques and skills needed for reducing stress and depression.   Education: Stress, Anxiety, and Depression - Group verbal and visual presentation to define topics covered.  Reviews how body is impacted by stress, anxiety, and depression.  Also discusses healthy ways to reduce stress and to treat/manage anxiety and depression.  Written material given at graduation.   Education: Sleep Hygiene -Provides group verbal and written instruction about how sleep can affect your health.  Define sleep hygiene, discuss sleep cycles and impact of sleep habits. Review good sleep hygiene tips.    Initial Review & Psychosocial Screening:  Initial Psych Review & Screening - 05/07/22 1416       Initial Review   Current issues with None Identified      Family Dynamics   Good Support System? Yes   one son,his  wife and the grandchildren. 2 sisters     Barriers   Psychosocial barriers to participate in program There are no identifiable barriers or psychosocial needs.      Screening Interventions   Interventions Encouraged to exercise;To provide support and resources with identified psychosocial needs;Provide feedback about the scores to participant    Expected Outcomes Short Term goal: Utilizing psychosocial counselor, staff and physician to assist with identification of specific Stressors  or current issues interfering with healing process. Setting desired goal for each stressor or current issue identified.;Long Term Goal: Stressors or current issues are controlled or eliminated.;Short Term goal: Identification and review with participant of any Quality of Life or Depression concerns found by scoring the questionnaire.;Long Term goal: The participant improves quality of Life and PHQ9 Scores as seen by post scores and/or verbalization of changes             Quality of Life Scores:   Quality of Life - 05/15/22 1055       Quality of Life   Select Quality of Life      Quality of Life Scores   Health/Function Pre 20.54 %    Socioeconomic Pre 23.43 %    Psych/Spiritual Pre 23.57 %    Family Pre 25.5 %    GLOBAL Pre 22.52 %            Scores of 19 and below usually indicate a poorer quality of life in these areas.  A difference of  2-3 points is a clinically meaningful difference.  A difference of 2-3 points in the total score of the Quality of Life Index has been associated with significant improvement in overall quality of life, self-image, physical symptoms, and general health in studies assessing change in quality of life.  PHQ-9: Review Flowsheet       05/15/2022  Depression screen PHQ 2/9  Decreased Interest 1  Down, Depressed, Hopeless 0  PHQ - 2 Score 1  Altered sleeping 0  Tired, decreased energy 1  Change in appetite 1  Feeling bad or failure about yourself  0  Trouble  concentrating 0  Moving slowly or fidgety/restless 0  Suicidal thoughts 0  PHQ-9 Score 3  Difficult doing work/chores Somewhat difficult   Interpretation of Total Score  Total Score Depression Severity:  1-4 = Minimal depression, 5-9 = Mild depression, 10-14 = Moderate depression, 15-19 = Moderately severe depression, 20-27 = Severe depression   Psychosocial Evaluation and Intervention:  Psychosocial Evaluation - 05/07/22 1428       Psychosocial Evaluation & Interventions   Interventions Encouraged to exercise with the program and follow exercise prescription    Comments Grisel has no barriers to attending the program.  [facility] is ready to get started. She lives alone,has been widowed for many years. SHe has a son,his wife and the grandchildren for support. She is ready to get started.    Expected Outcomes STG Saman attends all scheduled sessions, she is able to manage the entire exercise session.     LTG Janasia will continue to progress her exercise regimen.    Continue Psychosocial Services  Follow up required by staff             Psychosocial Re-Evaluation:   Psychosocial Discharge (Final Psychosocial Re-Evaluation):   Vocational Rehabilitation: Provide vocational rehab assistance to qualifying candidates.   Vocational Rehab Evaluation & Intervention:   Education: Education Goals: Education classes will be provided on a variety of topics geared toward better understanding of heart health and risk factor modification. Participant will state understanding/return demonstration of topics presented as noted by education test scores.  Learning Barriers/Preferences:   General Cardiac Education Topics:  AED/CPR: - Group verbal and written instruction with the use of models to demonstrate the basic use of the AED with the basic ABC's of resuscitation.   Anatomy and Cardiac Procedures: - Group verbal and visual presentation and models provide information about basic cardiac  anatomy and  function. Reviews the testing methods done to diagnose heart disease and the outcomes of the test results. Describes the treatment choices: Medical Management, Angioplasty, or Coronary Bypass Surgery for treating various heart conditions including Myocardial Infarction, Angina, Valve Disease, and Cardiac Arrhythmias.  Written material given at graduation.   Medication Safety: - Group verbal and visual instruction to review commonly prescribed medications for heart and lung disease. Reviews the medication, class of the drug, and side effects. Includes the steps to properly store meds and maintain the prescription regimen.  Written material given at graduation.   Intimacy: - Group verbal instruction through game format to discuss how heart and lung disease can affect sexual intimacy. Written material given at graduation..   Know Your Numbers and Heart Failure: - Group verbal and visual instruction to discuss disease risk factors for cardiac and pulmonary disease and treatment options.  Reviews associated critical values for Overweight/Obesity, Hypertension, Cholesterol, and Diabetes.  Discusses basics of heart failure: signs/symptoms and treatments.  Introduces Heart Failure Zone chart for action plan for heart failure.  Written material given at graduation.   Infection Prevention: - Provides verbal and written material to individual with discussion of infection control including proper hand washing and proper equipment cleaning during exercise session. Flowsheet Row Cardiac Rehab from 05/15/2022 in [facility] Cardiac and Pulmonary Rehab  Education need identified 05/15/22  Date 05/15/22  Educator [place]  Instruction Review Code 1- Verbalizes Understanding       Falls Prevention: - Provides verbal and written material to individual with discussion of falls prevention and safety. Flowsheet Row Cardiac Rehab from 05/15/2022 in [facility] Cardiac and Pulmonary Rehab  Education need identified  05/15/22  Date 05/15/22  Educator [place]  Instruction Review Code 1- Verbalizes Understanding       Other: -Provides group and verbal instruction on various topics (see comments)   Knowledge Questionnaire Score:  Knowledge Questionnaire Score - 05/15/22 1054       Knowledge Questionnaire Score   Pre Score 24/26             Core Components/Risk Factors/Patient Goals at Admission:  Personal Goals and Risk Factors at Admission - 05/15/22 1118       Core Components/Risk Factors/Patient Goals on Admission    Weight Management Yes;Weight Loss    Intervention Weight Management: Develop a combined nutrition and exercise program designed to reach desired caloric intake, while maintaining appropriate intake of nutrient and fiber, sodium and fats, and appropriate energy expenditure required for the weight goal.;Weight Management: Provide education and appropriate resources to help participant work on and attain dietary goals.    Admit Weight 201 lb (91.2 kg)    Goal Weight: Short Term 196 lb (88.9 kg)    Goal Weight: Long Term 185 lb (83.9 kg)    Expected Outcomes Short Term: Continue to assess and modify interventions until short term weight is achieved;Long Term: Adherence to nutrition and physical activity/exercise program aimed toward attainment of established weight goal;Weight Loss: Understanding of general recommendations for a balanced deficit meal plan, which promotes 1-2 lb weight loss per week and includes a negative energy balance of [phone] kcal/d;Understanding recommendations for meals to include 15-35% energy as protein, 25-35% energy from fat, 35-60% energy from carbohydrates, less than 200mg  of dietary cholesterol, 20-35 gm of total fiber daily;Understanding of distribution of calorie intake throughout the day with the consumption of 4-5 meals/snacks    Heart Failure Yes    Intervention Provide a combined exercise and nutrition program that is supplemented  with education,  support and counseling about heart failure. Directed toward relieving symptoms such as shortness of breath, decreased exercise tolerance, and extremity edema.    Expected Outcomes Improve functional capacity of life;Short term: Attendance in program 2-3 days a week with increased exercise capacity. Reported lower sodium intake. Reported increased fruit and vegetable intake. Reports medication compliance.;Short term: Daily weights obtained and reported for increase. Utilizing diuretic protocols set by physician.;Long term: Adoption of self-care skills and reduction of barriers for early signs and symptoms recognition and intervention leading to self-care maintenance.    Lipids Yes    Intervention Provide education and support for participant on nutrition & aerobic/resistive exercise along with prescribed medications to achieve LDL 70mg , HDL >40mg .    Expected Outcomes Short Term: Participant states understanding of desired cholesterol values and is compliant with medications prescribed. Participant is following exercise prescription and nutrition guidelines.;Long Term: Cholesterol controlled with medications as prescribed, with individualized exercise RX and with personalized nutrition plan. Value goals: LDL < 70mg , HDL > 40 mg.             Education:Diabetes - Individual verbal and written instruction to review signs/symptoms of diabetes, desired ranges of glucose level fasting, after meals and with exercise. Acknowledge that pre and post exercise glucose checks will be done for 3 sessions at entry of program.   Core Components/Risk Factors/Patient Goals Review:    Core Components/Risk Factors/Patient Goals at Discharge (Final Review):    ITP Comments:  ITP Comments     Row Name 05/07/22 1427 05/15/22 1052 05/21/22 1324       ITP Comments Virtual orientation call completed today. shehas an appointment on Date: 05/15/2022  for EP eval and gym Orientation.  Documentation of diagnosis can  be found in [facility] Date: 04/08/2022 . Completed 04/10/2022 and gym orientation. Initial ITP created and sent for review to Dr. , Medical Director. 30 Day review completed. Medical Director ITP review done, changes made as directed, and signed approval by Medical Director.    New              Comments:

## 2022-05-22 ENCOUNTER — Telehealth: Payer: Self-pay

## 2022-05-22 NOTE — Telephone Encounter (Signed)
Left message for patient regarding cardiac rehab. Missed first day yesterday.

## 2022-05-26 ENCOUNTER — Ambulatory Visit: Payer: No Typology Code available for payment source

## 2022-05-26 DIAGNOSIS — I5022 Chronic systolic (congestive) heart failure: Secondary | ICD-10-CM

## 2022-05-26 NOTE — Progress Notes (Signed)
Cardiac Individual Treatment Plan  Patient Details  Name: SOPHIAH ROLIN MRN: [id number] Date of Birth: 09-Nov-1945 Referring Provider:   Flowsheet Row Cardiac Rehab from 05/15/2022 in [facility] Cardiac and Pulmonary Rehab  Referring Provider Julien Nordmann MD       Initial Encounter Date:  Flowsheet Row Cardiac Rehab from 05/15/2022 in [facility] Cardiac and Pulmonary Rehab  Date 05/15/22       Visit Diagnosis: Systolic heart failure, chronic (HCC)  Patient's Home Medications on Admission:  Current Outpatient Medications:    acetaminophen (TYLENOL) 500 MG tablet, Take 500-1,000 mg by mouth every 6 (six) hours as needed for mild pain or headache., Disp: , Rfl:    aspirin 81 MG chewable tablet, Chew 81 mg by mouth daily., Disp: , Rfl:    celecoxib (CELEBREX) 200 MG capsule, Take 1 capsule (200 mg total) by mouth 2 (two) times daily. (Patient not taking: Reported on 05/07/2022), Disp: 90 capsule, Rfl: 0   Cyanocobalamin (B-12) 3000 MCG CAPS, Take 3,000 mcg by mouth every other day., Disp: , Rfl:    enoxaparin (LOVENOX) 40 MG/0.4ML injection, Inject 0.4 mLs (40 mg total) into the skin daily for 14 days. (Patient not taking: Reported on 04/08/2022), Disp: 5.6 mL, Rfl: 0   ibuprofen (ADVIL) 200 MG tablet, Take 200 mg by mouth every 6 (six) hours as needed for moderate pain or headache. (Patient not taking: Reported on 05/07/2022), Disp: , Rfl:    levothyroxine (SYNTHROID) 88 MCG tablet, Take 88 mcg by mouth daily before breakfast., Disp: , Rfl:    levothyroxine (SYNTHROID, LEVOTHROID) 100 MCG tablet, Take 100 mcg by mouth daily before breakfast. (Patient not taking: Reported on 04/08/2022), Disp: , Rfl:    Multiple Vitamin (MULTIVITAMIN WITH MINERALS) TABS tablet, Take 1 tablet by mouth daily., Disp: , Rfl:    ondansetron (ZOFRAN-ODT) 4 MG disintegrating tablet, Take 1 tablet (4 mg total) by mouth every 8 (eight) hours as needed for nausea or vomiting. (Patient not taking: Reported on 04/08/2022), Disp: 10  tablet, Rfl: 0   sacubitril-valsartan (ENTRESTO) 24-26 MG, Take 1 tablet by mouth 2 (two) times daily., Disp: 180 tablet, Rfl: 3   traMADol (ULTRAM) 50 MG tablet, Take 1 tablet (50 mg total) by mouth every 4 (four) hours as needed for moderate pain. (Patient not taking: Reported on 04/08/2022), Disp: 30 tablet, Rfl: 0  Past Medical History: Past Medical History:  Diagnosis Date   Arthritis    B12 deficiency    CAD (coronary artery disease)    DCM (dilated cardiomyopathy) (HCC)    Dyspnea    GERD (gastroesophageal reflux disease)    HLD (hyperlipidemia)    Hypothyroidism    Non Q wave myocardial infarction (HCC) 2005    Tobacco Use: Social History   Tobacco Use  Smoking Status Never  Smokeless Tobacco Never    Labs: Review Flowsheet        No data to display           Exercise Target Goals: Exercise Program Goal: Individual exercise prescription set using results from initial 6 min walk test and THRR while considering  patient's activity barriers and safety.   Exercise Prescription Goal: Initial exercise prescription builds to 30-45 minutes a day of aerobic activity, 2-3 days per week.  Home exercise guidelines will be given to patient during program as part of exercise prescription that the participant will acknowledge.   Education: Aerobic Exercise: - Group verbal and visual presentation on the components of exercise prescription. Introduces F.I.T.T  principle from ACSM for exercise prescriptions.  Reviews F.I.T.T. principles of aerobic exercise including progression. Written material given at graduation. Flowsheet Row Cardiac Rehab from 05/15/2022 in [facility] Cardiac and Pulmonary Rehab  Education need identified 05/15/22       Education: Resistance Exercise: - Group verbal and visual presentation on the components of exercise prescription. Introduces F.I.T.T principle from ACSM for exercise prescriptions  Reviews F.I.T.T. principles of resistance exercise including  progression. Written material given at graduation.    Education: Exercise & Equipment Safety: - Individual verbal instruction and demonstration of equipment use and safety with use of the equipment. Flowsheet Row Cardiac Rehab from 05/15/2022 in [facility] Cardiac and Pulmonary Rehab  Education need identified 05/15/22  Date 05/15/22  Educator [place]  Instruction Review Code 1- Verbalizes Understanding       Education: Exercise Physiology & General Exercise Guidelines: - Group verbal and written instruction with models to review the exercise physiology of the cardiovascular system and associated critical values. Provides general exercise guidelines with specific guidelines to those with heart or lung disease.    Education: Flexibility, Balance, Mind/Body Relaxation: - Group verbal and visual presentation with interactive activity on the components of exercise prescription. Introduces F.I.T.T principle from ACSM for exercise prescriptions. Reviews F.I.T.T. principles of flexibility and balance exercise training including progression. Also discusses the mind body connection.  Reviews various relaxation techniques to help reduce and manage stress (i.e. Deep breathing, progressive muscle relaxation, and visualization). Balance handout provided to take home. Written material given at graduation.   Activity Barriers & Risk Stratification:  Activity Barriers & Cardiac Risk Stratification - 05/15/22 1114       Activity Barriers & Cardiac Risk Stratification   Activity Barriers Right Knee Replacement;Left Knee Replacement;Deconditioning;Muscular Weakness    Cardiac Risk Stratification High             6 Minute Walk:  6 Minute Walk     Row Name 05/15/22 1114         6 Minute Walk   Phase Initial     Distance 940 feet     Walk Time 6 minutes     # of Rest Breaks 0     MPH 1.78     METS 1.39     RPE 9     Perceived Dyspnea  0     VO2 Peak 4.89     Symptoms No     Resting HR 65 bpm      Resting BP 110/62     Resting Oxygen Saturation  96 %     Exercise Oxygen Saturation  during 6 min walk 96 %     Max Ex. HR 96 bpm     Max Ex. BP 122/60     2 Minute Post BP 104/60              Oxygen Initial Assessment:   Oxygen Re-Evaluation:   Oxygen Discharge (Final Oxygen Re-Evaluation):   Initial Exercise Prescription:  Initial Exercise Prescription - 05/15/22 1100       Date of Initial Exercise RX and Referring Provider   Date 05/15/22    Referring Provider Julien Nordmann MD      Oxygen   Maintain Oxygen Saturation 88% or higher      Recumbant Bike   Level 1    RPM 60    Watts 17    Minutes 15    METs 1.3      NuStep   Level 1  SPM 80    Minutes 15    METs 1.3      T5 Nustep   Level 1    SPM 80    Minutes 15    METs 1.3      Biostep-RELP   Level 1    SPM 50    Minutes 15    METs 1.3      Track   Laps 21    Minutes 15    METs 1.4      Prescription Details   Frequency (times per week) 2    Duration Progress to 30 minutes of continuous aerobic without signs/symptoms of physical distress      Intensity   THRR 40-80% of Max Heartrate 95 - 126    Ratings of Perceived Exertion 11-13    Perceived Dyspnea 0-4      Progression   Progression Continue to progress workloads to maintain intensity without signs/symptoms of physical distress.      Resistance Training   Training Prescription Yes    Weight 3 lb    Reps 10-15             Perform Capillary Blood Glucose checks as needed.  Exercise Prescription Changes:   Exercise Prescription Changes     Row Name 05/15/22 1100             Response to Exercise   Blood Pressure (Admit) 110/62       Blood Pressure (Exercise) 122/60       Blood Pressure (Exit) 104/60       Heart Rate (Admit) 65 bpm       Heart Rate (Exercise) 96 bpm       Heart Rate (Exit) 69 bpm       Oxygen Saturation (Admit) 96 %       Oxygen Saturation (Exercise) 96 %       Oxygen Saturation (Exit) 96 %        Rating of Perceived Exertion (Exercise) 9       Perceived Dyspnea (Exercise) 0       Symptoms none       Comments walk test results                Exercise Comments:   Exercise Goals and Review:   Exercise Goals     Row Name 05/15/22 1117             Exercise Goals   Increase Physical Activity Yes       Intervention Provide advice, education, support and counseling about physical activity/exercise needs.;Develop an individualized exercise prescription for aerobic and resistive training based on initial evaluation findings, risk stratification, comorbidities and participant's personal goals.       Expected Outcomes Short Term: Attend rehab on a regular basis to increase amount of physical activity.;Long Term: Add in home exercise to make exercise part of routine and to increase amount of physical activity.;Long Term: Exercising regularly at least 3-5 days a week.       Increase Strength and Stamina Yes       Intervention Provide advice, education, support and counseling about physical activity/exercise needs.;Develop an individualized exercise prescription for aerobic and resistive training based on initial evaluation findings, risk stratification, comorbidities and participant's personal goals.       Expected Outcomes Short Term: Increase workloads from initial exercise prescription for resistance, speed, and METs.;Short Term: Perform resistance training exercises routinely during rehab and add in resistance training at home;Long Term: Improve  cardiorespiratory fitness, muscular endurance and strength as measured by increased METs and functional capacity ( )       Able to understand and use rate of perceived exertion (RPE) scale Yes       Intervention Provide education and explanation on how to use RPE scale       Expected Outcomes Short Term: Able to use RPE daily in rehab to express subjective intensity level;Long Term:  Able to use RPE to guide intensity level when  exercising independently       Able to understand and use Dyspnea scale Yes       Intervention Provide education and explanation on how to use Dyspnea scale       Expected Outcomes Short Term: Able to use Dyspnea scale daily in rehab to express subjective sense of shortness of breath during exertion;Long Term: Able to use Dyspnea scale to guide intensity level when exercising independently       Knowledge and understanding of Target Heart Rate Range (THRR) Yes       Intervention Provide education and explanation of THRR including how the numbers were predicted and where they are located for reference       Expected Outcomes Short Term: Able to state/look up THRR;Long Term: Able to use THRR to govern intensity when exercising independently;Short Term: Able to use daily as guideline for intensity in rehab       Able to check pulse independently Yes       Intervention Provide education and demonstration on how to check pulse in carotid and radial arteries.;Review the importance of being able to check your own pulse for safety during independent exercise       Expected Outcomes Short Term: Able to explain why pulse checking is important during independent exercise;Long Term: Able to check pulse independently and accurately       Understanding of Exercise Prescription Yes       Intervention Provide education, explanation, and written materials on patient's individual exercise prescription       Expected Outcomes Short Term: Able to explain program exercise prescription;Long Term: Able to explain home exercise prescription to exercise independently                Exercise Goals Re-Evaluation :   Discharge Exercise Prescription (Final Exercise Prescription Changes):  Exercise Prescription Changes - 05/15/22 1100       Response to Exercise   Blood Pressure (Admit) 110/62    Blood Pressure (Exercise) 122/60    Blood Pressure (Exit) 104/60    Heart Rate (Admit) 65 bpm    Heart Rate (Exercise)  96 bpm    Heart Rate (Exit) 69 bpm    Oxygen Saturation (Admit) 96 %    Oxygen Saturation (Exercise) 96 %    Oxygen Saturation (Exit) 96 %    Rating of Perceived Exertion (Exercise) 9    Perceived Dyspnea (Exercise) 0    Symptoms none    Comments walk test results             Nutrition:  Target Goals: Understanding of nutrition guidelines, daily intake of sodium 1500mg , cholesterol 200mg , calories 30% from fat and 7% or less from saturated fats, daily to have 5 or more servings of fruits and vegetables.  Education: All About Nutrition: -Group instruction provided by verbal, written material, interactive activities, discussions, models, and posters to present general guidelines for heart healthy nutrition including fat, fiber, MyPlate, the role of sodium in heart healthy nutrition, utilization of the  nutrition label, and utilization of this knowledge for meal planning. Follow up email sent as well. Written material given at graduation. Flowsheet Row Cardiac Rehab from 05/15/2022 in [facility] Cardiac and Pulmonary Rehab  Education need identified 05/15/22       Biometrics:  Pre Biometrics - 05/15/22 1112       Pre Biometrics   Height 5' 5.5" (1.664 m)    Weight 201 lb 6.4 oz (91.4 kg)    Waist Circumference 43.5 inches    Hip Circumference 49 inches    Waist to Hip Ratio 0.89 %    BMI (Calculated) 32.99    Single Leg Stand 6.83 seconds              Nutrition Therapy Plan and Nutrition Goals:  Nutrition Therapy & Goals - 05/15/22 1058       Intervention Plan   Intervention Prescribe, educate and counsel regarding individualized specific dietary modifications aiming towards targeted core components such as weight, hypertension, lipid management, diabetes, heart failure and other comorbidities.    Expected Outcomes Short Term Goal: Understand basic principles of dietary content, such as calories, fat, sodium, cholesterol and nutrients.;Short Term Goal: A plan has been  developed with personal nutrition goals set during dietitian appointment.;Long Term Goal: Adherence to prescribed nutrition plan.             Nutrition Assessments:  MEDIFICTS Score Key: ?70 Need to make dietary changes  40-70 Heart Healthy Diet ? 40 Therapeutic Level Cholesterol Diet  Flowsheet Row Cardiac Rehab from 05/15/2022 in [facility] Cardiac and Pulmonary Rehab  Picture Your Plate Total Score on Admission 55      Picture Your Plate Scores: <93 Unhealthy dietary pattern with much room for improvement. 41-50 Dietary pattern unlikely to meet recommendations for good health and room for improvement. 51-60 More healthful dietary pattern, with some room for improvement.  >60 Healthy dietary pattern, although there may be some specific behaviors that could be improved.    Nutrition Goals Re-Evaluation:   Nutrition Goals Discharge (Final Nutrition Goals Re-Evaluation):   Psychosocial: Target Goals: Acknowledge presence or absence of significant depression and/or stress, maximize coping skills, provide positive support system. Participant is able to verbalize types and ability to use techniques and skills needed for reducing stress and depression.   Education: Stress, Anxiety, and Depression - Group verbal and visual presentation to define topics covered.  Reviews how body is impacted by stress, anxiety, and depression.  Also discusses healthy ways to reduce stress and to treat/manage anxiety and depression.  Written material given at graduation.   Education: Sleep Hygiene -Provides group verbal and written instruction about how sleep can affect your health.  Define sleep hygiene, discuss sleep cycles and impact of sleep habits. Review good sleep hygiene tips.    Initial Review & Psychosocial Screening:  Initial Psych Review & Screening - 05/07/22 1416       Initial Review   Current issues with None Identified      Family Dynamics   Good Support System? Yes   one son,his  wife and the grandchildren. 2 sisters     Barriers   Psychosocial barriers to participate in program There are no identifiable barriers or psychosocial needs.      Screening Interventions   Interventions Encouraged to exercise;To provide support and resources with identified psychosocial needs;Provide feedback about the scores to participant    Expected Outcomes Short Term goal: Utilizing psychosocial counselor, staff and physician to assist with identification of specific Stressors  or current issues interfering with healing process. Setting desired goal for each stressor or current issue identified.;Long Term Goal: Stressors or current issues are controlled or eliminated.;Short Term goal: Identification and review with participant of any Quality of Life or Depression concerns found by scoring the questionnaire.;Long Term goal: The participant improves quality of Life and PHQ9 Scores as seen by post scores and/or verbalization of changes             Quality of Life Scores:   Quality of Life - 05/15/22 1055       Quality of Life   Select Quality of Life      Quality of Life Scores   Health/Function Pre 20.54 %    Socioeconomic Pre 23.43 %    Psych/Spiritual Pre 23.57 %    Family Pre 25.5 %    GLOBAL Pre 22.52 %            Scores of 19 and below usually indicate a poorer quality of life in these areas.  A difference of  2-3 points is a clinically meaningful difference.  A difference of 2-3 points in the total score of the Quality of Life Index has been associated with significant improvement in overall quality of life, self-image, physical symptoms, and general health in studies assessing change in quality of life.  PHQ-9: Review Flowsheet       05/15/2022  Depression screen PHQ 2/9  Decreased Interest 1  Down, Depressed, Hopeless 0  PHQ - 2 Score 1  Altered sleeping 0  Tired, decreased energy 1  Change in appetite 1  Feeling bad or failure about yourself  0  Trouble  concentrating 0  Moving slowly or fidgety/restless 0  Suicidal thoughts 0  PHQ-9 Score 3  Difficult doing work/chores Somewhat difficult   Interpretation of Total Score  Total Score Depression Severity:  1-4 = Minimal depression, 5-9 = Mild depression, 10-14 = Moderate depression, 15-19 = Moderately severe depression, 20-27 = Severe depression   Psychosocial Evaluation and Intervention:  Psychosocial Evaluation - 05/07/22 1428       Psychosocial Evaluation & Interventions   Interventions Encouraged to exercise with the program and follow exercise prescription    Comments Grisel has no barriers to attending the program.  [facility] is ready to get started. She lives alone,has been widowed for many years. SHe has a son,his wife and the grandchildren for support. She is ready to get started.    Expected Outcomes STG Saman attends all scheduled sessions, she is able to manage the entire exercise session.     LTG Janasia will continue to progress her exercise regimen.    Continue Psychosocial Services  Follow up required by staff             Psychosocial Re-Evaluation:   Psychosocial Discharge (Final Psychosocial Re-Evaluation):   Vocational Rehabilitation: Provide vocational rehab assistance to qualifying candidates.   Vocational Rehab Evaluation & Intervention:   Education: Education Goals: Education classes will be provided on a variety of topics geared toward better understanding of heart health and risk factor modification. Participant will state understanding/return demonstration of topics presented as noted by education test scores.  Learning Barriers/Preferences:   General Cardiac Education Topics:  AED/CPR: - Group verbal and written instruction with the use of models to demonstrate the basic use of the AED with the basic ABC's of resuscitation.   Anatomy and Cardiac Procedures: - Group verbal and visual presentation and models provide information about basic cardiac  anatomy and  function. Reviews the testing methods done to diagnose heart disease and the outcomes of the test results. Describes the treatment choices: Medical Management, Angioplasty, or Coronary Bypass Surgery for treating various heart conditions including Myocardial Infarction, Angina, Valve Disease, and Cardiac Arrhythmias.  Written material given at graduation.   Medication Safety: - Group verbal and visual instruction to review commonly prescribed medications for heart and lung disease. Reviews the medication, class of the drug, and side effects. Includes the steps to properly store meds and maintain the prescription regimen.  Written material given at graduation.   Intimacy: - Group verbal instruction through game format to discuss how heart and lung disease can affect sexual intimacy. Written material given at graduation..   Know Your Numbers and Heart Failure: - Group verbal and visual instruction to discuss disease risk factors for cardiac and pulmonary disease and treatment options.  Reviews associated critical values for Overweight/Obesity, Hypertension, Cholesterol, and Diabetes.  Discusses basics of heart failure: signs/symptoms and treatments.  Introduces Heart Failure Zone chart for action plan for heart failure.  Written material given at graduation.   Infection Prevention: - Provides verbal and written material to individual with discussion of infection control including proper hand washing and proper equipment cleaning during exercise session. Flowsheet Row Cardiac Rehab from 05/15/2022 in [facility] Cardiac and Pulmonary Rehab  Education need identified 05/15/22  Date 05/15/22  Educator [place]  Instruction Review Code 1- Verbalizes Understanding       Falls Prevention: - Provides verbal and written material to individual with discussion of falls prevention and safety. Flowsheet Row Cardiac Rehab from 05/15/2022 in [facility] Cardiac and Pulmonary Rehab  Education need identified  05/15/22  Date 05/15/22  Educator [place]  Instruction Review Code 1- Verbalizes Understanding       Other: -Provides group and verbal instruction on various topics (see comments)   Knowledge Questionnaire Score:  Knowledge Questionnaire Score - 05/15/22 1054       Knowledge Questionnaire Score   Pre Score 24/26             Core Components/Risk Factors/Patient Goals at Admission:  Personal Goals and Risk Factors at Admission - 05/15/22 1118       Core Components/Risk Factors/Patient Goals on Admission    Weight Management Yes;Weight Loss    Intervention Weight Management: Develop a combined nutrition and exercise program designed to reach desired caloric intake, while maintaining appropriate intake of nutrient and fiber, sodium and fats, and appropriate energy expenditure required for the weight goal.;Weight Management: Provide education and appropriate resources to help participant work on and attain dietary goals.    Admit Weight 201 lb (91.2 kg)    Goal Weight: Short Term 196 lb (88.9 kg)    Goal Weight: Long Term 185 lb (83.9 kg)    Expected Outcomes Short Term: Continue to assess and modify interventions until short term weight is achieved;Long Term: Adherence to nutrition and physical activity/exercise program aimed toward attainment of established weight goal;Weight Loss: Understanding of general recommendations for a balanced deficit meal plan, which promotes 1-2 lb weight loss per week and includes a negative energy balance of [phone] kcal/d;Understanding recommendations for meals to include 15-35% energy as protein, 25-35% energy from fat, 35-60% energy from carbohydrates, less than 200mg  of dietary cholesterol, 20-35 gm of total fiber daily;Understanding of distribution of calorie intake throughout the day with the consumption of 4-5 meals/snacks    Heart Failure Yes    Intervention Provide a combined exercise and nutrition program that is supplemented  with education,  support and counseling about heart failure. Directed toward relieving symptoms such as shortness of breath, decreased exercise tolerance, and extremity edema.    Expected Outcomes Improve functional capacity of life;Short term: Attendance in program 2-3 days a week with increased exercise capacity. Reported lower sodium intake. Reported increased fruit and vegetable intake. Reports medication compliance.;Short term: Daily weights obtained and reported for increase. Utilizing diuretic protocols set by physician.;Long term: Adoption of self-care skills and reduction of barriers for early signs and symptoms recognition and intervention leading to self-care maintenance.    Lipids Yes    Intervention Provide education and support for participant on nutrition & aerobic/resistive exercise along with prescribed medications to achieve LDL 70mg , HDL >40mg .    Expected Outcomes Short Term: Participant states understanding of desired cholesterol values and is compliant with medications prescribed. Participant is following exercise prescription and nutrition guidelines.;Long Term: Cholesterol controlled with medications as prescribed, with individualized exercise RX and with personalized nutrition plan. Value goals: LDL < 70mg , HDL > 40 mg.             Education:Diabetes - Individual verbal and written instruction to review signs/symptoms of diabetes, desired ranges of glucose level fasting, after meals and with exercise. Acknowledge that pre and post exercise glucose checks will be done for 3 sessions at entry of program.   Core Components/Risk Factors/Patient Goals Review:    Core Components/Risk Factors/Patient Goals at Discharge (Final Review):    ITP Comments:  ITP Comments     Row Name 05/07/22 1427 05/15/22 1052 05/21/22 1324 05/26/22 0931     ITP Comments Virtual orientation call completed today. shehas an appointment on Date: 05/15/2022  for EP eval and gym Orientation.  Documentation of  diagnosis can be found in [facility] Date: 04/08/2022 . Completed 6MWT and gym orientation. Initial ITP created and sent for review to Dr. Emily Filbert, Medical Director. 30 Day review completed. Medical Director ITP review done, changes made as directed, and signed approval by Medical Director.    New Patient called back and left voicemail stating she did not want to complete the cardiac rehab program at this time. Will discharge per patient request.             Comments: Discharge ITP

## 2022-05-26 NOTE — Telephone Encounter (Signed)
Patient called back and left voicemail stating she did not want to complete the cardiac rehab program at this time. Will discharge per patient request. 

## 2022-05-26 NOTE — Progress Notes (Signed)
Discharge Progress Report  Patient Details  Name: Cindy Harper MRN: [id number] Date of Birth: Aug 28, 1946 Referring Provider:   Flowsheet Row Cardiac [facility]  Referring Provider Julien Nordmann MD        Number of Visits: 1  Reason for Discharge:  Early Exit:  Personal: Personal choice, did not want to complete with program  Smoking History:  Social History   Tobacco Use  Smoking Status Never  Smokeless Tobacco Never    Diagnosis:  Systolic heart failure, chronic (HCC)  ADL [facility]:   Initial Exercise Prescription:  Initial Exercise Prescription - 05/15/22 1100       Date of Initial Exercise RX and Referring Provider   Date 05/15/22    Referring Provider Julien Nordmann MD      Oxygen   Maintain Oxygen Saturation 88% or higher      Recumbant Bike   Level 1    RPM 60    Watts 17    Minutes 15    METs 1.3      NuStep   Level 1    SPM 80    Minutes 15    METs 1.3      T5 Nustep   Level 1    SPM 80    Minutes 15    METs 1.3      Biostep-RELP   Level 1    SPM 50    Minutes 15    METs 1.3      Track   Laps 21    Minutes 15    METs 1.4      Prescription Details   Frequency (times per week) 2    Duration Progress to 30 minutes of continuous aerobic without signs/symptoms of physical distress      Intensity   THRR 40-80% of Max Heartrate 95 - 126    Ratings of Perceived Exertion 11-13    Perceived Dyspnea 0-4      Progression   Progression Continue to progress workloads to maintain intensity without signs/symptoms of physical distress.      Resistance Training   Training Prescription Yes    Weight 3 lb    Reps 10-15             Discharge Exercise Prescription (Final Exercise Prescription Changes):  Exercise Prescription Changes - 05/15/22 1100       Response to Exercise   Blood Pressure (Admit) 110/62    Blood Pressure (Exercise) 122/60    Blood Pressure (Exit) 104/60    Heart  Rate (Admit) 65 bpm    Heart Rate (Exercise) 96 bpm    Heart Rate (Exit) 69 bpm    Oxygen Saturation (Admit) 96 %    Oxygen Saturation (Exercise) 96 %    Oxygen Saturation (Exit) 96 %    Rating of Perceived Exertion (Exercise) 9    Perceived Dyspnea (Exercise) 0    Symptoms none    Comments walk test results             Functional Capacity:  6 Minute Walk     Row Name 05/15/22 1114         6 Minute Walk   Phase Initial     Distance 940 feet     Walk Time 6 minutes     # of Rest Breaks 0     MPH 1.78     METS 1.39     RPE 9  Perceived Dyspnea  0     VO2 Peak 4.89     Symptoms No     Resting HR 65 bpm     Resting BP 110/62     Resting Oxygen Saturation  96 %     Exercise Oxygen Saturation  during 6 min walk 96 %     Max Ex. HR 96 bpm     Max Ex. BP 122/60     2 Minute Post BP 104/60              Psychological, QOL, Others - Outcomes: PHQ 2/9:    05/15/2022   10:55 AM  Depression screen PHQ 2/9  Decreased Interest 1  Down, Depressed, Hopeless 0  PHQ - 2 Score 1  Altered sleeping 0  Tired, decreased energy 1  Change in appetite 1  Feeling bad or failure about yourself  0  Trouble concentrating 0  Moving slowly or fidgety/restless 0  Suicidal thoughts 0  PHQ-9 Score 3  Difficult doing work/chores Somewhat difficult    Quality of Life:  Quality of Life - 05/15/22 1055       Quality of Life   Select Quality of Life      Quality of Life Scores   Health/Function Pre 20.54 %    Socioeconomic Pre 23.43 %    Psych/Spiritual Pre 23.57 %    Family Pre 25.5 %    GLOBAL Pre 22.52 %             Personal Goals: Goals established at orientation with interventions provided to work toward goal.  Personal Goals and Risk Factors at Admission - 05/15/22 1118       Core Components/Risk Factors/Patient Goals on Admission    Weight Management Yes;Weight Loss    Intervention Weight Management: Develop a combined nutrition and exercise program  designed to reach desired caloric intake, while maintaining appropriate intake of nutrient and fiber, sodium and fats, and appropriate energy expenditure required for the weight goal.;Weight Management: Provide education and appropriate resources to help participant work on and attain dietary goals.    Admit Weight 201 lb (91.2 kg)    Goal Weight: Short Term 196 lb (88.9 kg)    Goal Weight: Long Term 185 lb (83.9 kg)    Expected Outcomes Short Term: Continue to assess and modify interventions until short term weight is achieved;Long Term: Adherence to nutrition and physical activity/exercise program aimed toward attainment of established weight goal;Weight Loss: Understanding of general recommendations for a balanced deficit meal plan, which promotes 1-2 lb weight loss per week and includes a negative energy balance of [phone] kcal/d;Understanding recommendations for meals to include 15-35% energy as protein, 25-35% energy from fat, 35-60% energy from carbohydrates, less than 200mg  of dietary cholesterol, 20-35 gm of total fiber daily;Understanding of distribution of calorie intake throughout the day with the consumption of 4-5 meals/snacks    Heart Failure Yes    Intervention Provide a combined exercise and nutrition program that is supplemented with education, support and counseling about heart failure. Directed toward relieving symptoms such as shortness of breath, decreased exercise tolerance, and extremity edema.    Expected Outcomes Improve functional capacity of life;Short term: Attendance in program 2-3 days a week with increased exercise capacity. Reported lower sodium intake. Reported increased fruit and vegetable intake. Reports medication compliance.;Short term: Daily weights obtained and reported for increase. Utilizing diuretic protocols set by physician.;Long term: Adoption of self-care skills and reduction of barriers for early signs and symptoms  recognition and intervention leading to  self-care maintenance.    Lipids Yes    Intervention Provide education and support for participant on nutrition & aerobic/resistive exercise along with prescribed medications to achieve LDL 70mg , HDL >40mg .    Expected Outcomes Short Term: Participant states understanding of desired cholesterol values and is compliant with medications prescribed. Participant is following exercise prescription and nutrition guidelines.;Long Term: Cholesterol controlled with medications as prescribed, with individualized exercise RX and with personalized nutrition plan. Value goals: LDL < 70mg , HDL > 40 mg.              Personal Goals Discharge:   Exercise Goals and Review:  Exercise Goals     Row Name 05/15/22 1117             Exercise Goals   Increase Physical Activity Yes       Intervention Provide advice, education, support and counseling about physical activity/exercise needs.;Develop an individualized exercise prescription for aerobic and resistive training based on initial evaluation findings, risk stratification, comorbidities and participant's personal goals.       Expected Outcomes Short Term: Attend rehab on a regular basis to increase amount of physical activity.;Long Term: Add in home exercise to make exercise part of routine and to increase amount of physical activity.;Long Term: Exercising regularly at least 3-5 days a week.       Increase Strength and Stamina Yes       Intervention Provide advice, education, support and counseling about physical activity/exercise needs.;Develop an individualized exercise prescription for aerobic and resistive training based on initial evaluation findings, risk stratification, comorbidities and participant's personal goals.       Expected Outcomes Short Term: Increase workloads from initial exercise prescription for resistance, speed, and METs.;Short Term: Perform resistance training exercises routinely during rehab and add in resistance training at  home;Long Term: Improve cardiorespiratory fitness, muscular endurance and strength as measured by increased METs and functional capacity (05/17/22)       Able to understand and use rate of perceived exertion (RPE) scale Yes       Intervention Provide education and explanation on how to use RPE scale       Expected Outcomes Short Term: Able to use RPE daily in rehab to express subjective intensity level;Long Term:  Able to use RPE to guide intensity level when exercising independently       Able to understand and use Dyspnea scale Yes       Intervention Provide education and explanation on how to use Dyspnea scale       Expected Outcomes Short Term: Able to use Dyspnea scale daily in rehab to express subjective sense of shortness of breath during exertion;Long Term: Able to use Dyspnea scale to guide intensity level when exercising independently       Knowledge and understanding of Target Heart Rate Range (THRR) Yes       Intervention Provide education and explanation of THRR including how the numbers were predicted and where they are located for reference       Expected Outcomes Short Term: Able to state/look up THRR;Long Term: Able to use THRR to govern intensity when exercising independently;Short Term: Able to use daily as guideline for intensity in rehab       Able to check pulse independently Yes       Intervention Provide education and demonstration on how to check pulse in carotid and radial arteries.;Review the importance of being able to check your own pulse for safety  during independent exercise       Expected Outcomes Short Term: Able to explain why pulse checking is important during independent exercise;Long Term: Able to check pulse independently and accurately       Understanding of Exercise Prescription Yes       Intervention Provide education, explanation, and written materials on patient's individual exercise prescription       Expected Outcomes Short Term: Able to explain program  exercise prescription;Long Term: Able to explain home exercise prescription to exercise independently                Exercise Goals Re-Evaluation:   Nutrition & Weight - Outcomes:  Pre Biometrics - 05/15/22 1112       Pre Biometrics   Height 5' 5.5" (1.664 m)    Weight 201 lb 6.4 oz (91.4 kg)    Waist Circumference 43.5 inches    Hip Circumference 49 inches    Waist to Hip Ratio 0.89 %    BMI (Calculated) 32.99    Single Leg Stand 6.83 seconds              Nutrition:  Nutrition Therapy & Goals - 05/15/22 1058       Intervention Plan   Intervention Prescribe, educate and counsel regarding individualized specific dietary modifications aiming towards targeted core components such as weight, hypertension, lipid management, diabetes, heart failure and other comorbidities.    Expected Outcomes Short Term Goal: Understand basic principles of dietary content, such as calories, fat, sodium, cholesterol and nutrients.;Short Term Goal: A plan has been developed with personal nutrition goals set during dietitian appointment.;Long Term Goal: Adherence to prescribed nutrition plan.             Nutrition Discharge:   Education Questionnaire Score:  Knowledge Questionnaire Score - 05/15/22 1054       Knowledge Questionnaire Score   Pre Score 24/26             Goals reviewed with patient; copy given to patient.

## 2022-05-26 NOTE — Telephone Encounter (Signed)
Patient called back and left voicemail stating she did not want to complete the cardiac rehab program at this time. Will discharge per patient request.

## 2022-05-28 ENCOUNTER — Ambulatory Visit: Payer: No Typology Code available for payment source

## 2022-06-02 ENCOUNTER — Ambulatory Visit: Payer: No Typology Code available for payment source

## 2022-06-04 ENCOUNTER — Ambulatory Visit: Payer: No Typology Code available for payment source

## 2022-06-09 ENCOUNTER — Ambulatory Visit: Payer: No Typology Code available for payment source

## 2022-06-11 ENCOUNTER — Ambulatory Visit: Payer: No Typology Code available for payment source

## 2022-06-16 ENCOUNTER — Ambulatory Visit: Payer: No Typology Code available for payment source

## 2022-06-18 ENCOUNTER — Ambulatory Visit: Payer: No Typology Code available for payment source

## 2022-06-23 ENCOUNTER — Ambulatory Visit: Payer: No Typology Code available for payment source

## 2022-06-25 ENCOUNTER — Ambulatory Visit: Payer: No Typology Code available for payment source

## 2022-06-30 ENCOUNTER — Ambulatory Visit: Payer: No Typology Code available for payment source

## 2022-07-02 ENCOUNTER — Ambulatory Visit: Payer: No Typology Code available for payment source

## 2022-07-07 ENCOUNTER — Ambulatory Visit: Payer: No Typology Code available for payment source

## 2022-07-09 ENCOUNTER — Ambulatory Visit: Payer: No Typology Code available for payment source

## 2022-07-12 NOTE — Progress Notes (Unsigned)
Cardiology Office Note  Date:  07/14/2022   ID:  Cindy Harper [birth year], MRN [id number]  PCP:  Rusty Aus, MD   Chief Complaint  Patient presents with   3 month follow up     Patient c/o dizziness at times and has noticed some hair loss since started on Entresto. Medications reviewed by the patient verbally.     HPI:  [name] is a 76 year old woman with past medical history of CAD (NSTEMI 2005 treated medically at [facility]),  hyperlipidemia,  HFrEF (EF 35%) in 2019 and 2023 Who presents for f/u of her nonischemic cardiomyopathy  Last seen by myself in clinic July 2023 In follow-up today reports doing well overall Rare episodes of dizziness Otherwise has been tolerating Entresto 24/26 mg twice daily relatively well Concerned about some hair feels thin Some dizziness, orthostasis  Denies PND, orthopnea, no leg swelling no abdominal distention Has been active at baseline, no regular exercise program Could not afford cardiac rehab as it was not covered by insurance  EKG personally reviewed by myself on todays visit Normal sinus rhythm rate 78 bpm left axis deviation no significant ST-T wave changes  Prior cardiac history reviewed Previously followed by [facility] cardiology Reports getting overstressed while at [employer] 2005 leading to shortness of breath episodes, seen in the hospital in [place] for non-STEMI, diagnosed with nonobstructive coronary disease, records not available  Reviewed echocardiograms from 2019 and 2023 detailing ejection fraction 35% Stress test performed 2019 with no significant ischemia, fixed inferior wall defect  Cath 2019- No evidence of ischemia on ETT by electrocardiogram. EF 50%. Fixed inferior defect consistent with scar with no significant reversible ischemia. Clinical correlation recommended.  Echo 2019- MODERATE LV SYSTOLIC DYSFUNCTION (See above) NORMAL RIGHT VENTRICULAR SYSTOLIC FUNCTION MILD VALVULAR REGURGITATION  (See above) NO VALVULAR STENOSIS Moderate global Lv dysfunction with focal distal septal dyskinesis  Echo 12/2021- MODERATE LV SYSTOLIC DYSFUNCTION (See above) WITH MILD LVH NORMAL RIGHT VENTRICULAR SYSTOLIC FUNCTION MILD VALVULAR REGURGITATION (See above) NO VALVULAR STENOSIS MILD MR EF 35%  2019 Myoview results, which showed an EF of 50%, fixed inferior defect consistent with scar, with no reversible ischemia.  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  PSH:    Past Surgical History:  Procedure Laterality Date   ABDOMINAL HYSTERECTOMY     CARDIAC CATHETERIZATION N/A 2005   KNEE ARTHROPLASTY Left 07/09/2016   Procedure: COMPUTER ASSISTED TOTAL KNEE ARTHROPLASTY;  Surgeon: Dereck Leep, MD;  Location: [facility] ORS;  Service: Orthopedics;  Laterality: Left;   KNEE ARTHROPLASTY Right 03/04/2021   Procedure: COMPUTER ASSISTED TOTAL KNEE ARTHROPLASTY;  Surgeon: Dereck Leep, MD;  Location: [facility] ORS;  Service: Orthopedics;  Laterality: Right;    Current Outpatient Medications  Medication Sig Dispense Refill   acetaminophen (TYLENOL) 500 MG tablet Take 500-1,000 mg by mouth every 6 (six) hours as needed for mild pain or headache.     aspirin 81 MG chewable tablet Chew 81 mg by mouth daily.     levothyroxine (SYNTHROID) 88 MCG tablet Take 88 mcg by mouth daily before breakfast.     Multiple Vitamin (MULTIVITAMIN WITH MINERALS) TABS tablet Take 1 tablet by mouth daily.     sacubitril-valsartan (ENTRESTO) 24-26 MG Take 1 tablet by mouth 2 (two) times daily. 180 tablet 3   celecoxib (CELEBREX)  200 MG capsule Take 1 capsule (200 mg total) by mouth 2 (two) times daily. 90 capsule 0   enoxaparin (LOVENOX) 40 MG/0.4ML injection Inject 0.4 mLs (40 mg total) into the skin  daily for 14 days. (Patient not taking: Reported on 07/14/2022) 5.6 mL 0   ibuprofen (ADVIL) 200 MG tablet Take 200 mg by mouth every 6 (six) hours as needed for moderate pain or headache. (Patient not taking: Reported on 05/07/2022)     levothyroxine (SYNTHROID, LEVOTHROID) 100 MCG tablet Take 100 mcg by mouth daily before breakfast. (Patient not taking: Reported on 07/14/2022)     ondansetron (ZOFRAN-ODT) 4 MG disintegrating tablet Take 1 tablet (4 mg total) by mouth every 8 (eight) hours as needed for nausea or vomiting. (Patient not taking: Reported on 04/08/2022) 10 tablet 0   traMADol (ULTRAM) 50 MG tablet Take 1 tablet (50 mg total) by mouth every 4 (four) hours as needed for moderate pain. (Patient not taking: Reported on 04/08/2022) 30 tablet 0   No current facility-administered medications for this visit.     Allergies:   Ezetimibe-simvastatin   Social History:  The patient  reports that she has never smoked. She has never used smokeless tobacco. She reports that she does not drink alcohol and does not use drugs.   Family History:   family history includes Breast cancer in her maternal aunt; Breast cancer (age of onset: 49) in her mother; Kidney disease in her father.    Review of Systems: Review of Systems  Constitutional: Negative.   HENT: Negative.    Respiratory: Negative.    Cardiovascular: Negative.   Gastrointestinal: Negative.   Musculoskeletal: Negative.   Neurological: Negative.   Psychiatric/Behavioral: Negative.    All other systems reviewed and are negative.    PHYSICAL EXAM: VS:  BP 100/70 (BP Location: Left Arm, Patient Position: Sitting, Cuff Size: Normal)   Pulse 78   Ht 5\' 6"  (1.676 m)   Wt 199 lb (90.3 kg)   SpO2 97%   BMI 32.12 kg/m  , BMI Body mass index is 32.12 kg/m. Constitutional:  oriented to person, place, and time. No distress.  HENT:  Head: Grossly normal Eyes:  no discharge. No scleral icterus.  Neck: No JVD, no carotid bruits   Cardiovascular: Regular rate and rhythm, no murmurs appreciated Pulmonary/Chest: Clear to auscultation bilaterally, no wheezes or rails Abdominal: Soft.  no distension.  no tenderness.  Musculoskeletal: Normal range of motion Neurological:  normal muscle tone. Coordination normal. No atrophy Skin: Skin warm and dry Psychiatric: normal affect, pleasant  Recent Labs: 05/12/2022: BUN 19; Creatinine, Ser 1.02; Potassium 3.9; Sodium 142    Lipid Panel No results found for: "CHOL", "HDL", "LDLCALC", "TRIG"    Wt Readings from Last 3 Encounters:  07/14/22 199 lb (90.3 kg)  05/15/22 201 lb 6.4 oz (91.4 kg)  04/08/22 199 lb (90.3 kg)       ASSESSMENT AND PLAN:  Problem List Items Addressed This Visit       Cardiology Problems   CAD (coronary artery disease), native coronary artery   Cardiomyopathy ([place]) - Primary   Other Visit Diagnoses     Mixed hyperlipidemia          Dilated cardiomyopathy Ejection fraction 35% in 2019, ejection fraction in April 2023 EF 35% On prior clinic visit July 2023, changed from low-dose losartan to [facility] Blood pressure low, occasional dizziness concerning for orthostasis We will hold off on adding spironolactone Prior bradycardia and low blood pressure, will  hold off on adding beta-blocker Recent UTI, will not add Jardiance/Farxiga at this time No clear indication for diuretic at this time At her request repeat echocardiogram ordered  Coronary disease with stable angina Prior catheterization 2005 nonobstructive disease Stress test 2019 fixed inferior wall defect, no cardiac catheterization performed at that time Notes indicating per patient request On aspirin, not on beta-blocker secondary to bradycardia, does not appear to be on a statin Unable to afford cardiac rehab  Hyperlipidemia total cholesterol above 200 consider starting low-dose statin.     Total encounter time more than 30 minutes  Greater than 50% was spent in  counseling and coordination of care with the patient    Signed, Esmond Plants, M.D., Ph.D. [place], [place]

## 2022-07-14 ENCOUNTER — Ambulatory Visit
Payer: No Typology Code available for payment source | Attending: Cardiovascular Disease | Admitting: Cardiovascular Disease

## 2022-07-14 ENCOUNTER — Encounter: Payer: Self-pay | Admitting: Cardiovascular Disease

## 2022-07-14 ENCOUNTER — Ambulatory Visit: Payer: No Typology Code available for payment source

## 2022-07-14 VITALS — BP 100/70 | HR 78 | Ht 66.0 in | Wt 199.0 lb

## 2022-07-14 DIAGNOSIS — I420 Dilated cardiomyopathy: Secondary | ICD-10-CM | POA: Diagnosis not present

## 2022-07-14 DIAGNOSIS — E782 Mixed hyperlipidemia: Secondary | ICD-10-CM

## 2022-07-14 DIAGNOSIS — I25118 Atherosclerotic heart disease of native coronary artery with other forms of angina pectoris: Secondary | ICD-10-CM

## 2022-07-14 MED ORDER — ENTRESTO 24-26 MG PO TABS: 1.0000 | ORAL_TABLET | Freq: Two times a day (BID) | ORAL | 3 refills | Status: DC

## 2022-07-14 NOTE — Patient Instructions (Addendum)
Medication Instructions:  No changes  If you need a refill on your cardiac medications before your next appointment, please call your pharmacy.    Lab work: No new labs needed   Testing/Procedures: 1) Echocardiogram: (for cardiomyopathy) - Your physician has requested that you have an echocardiogram. Echocardiography is a painless test that uses sound waves to create images of your heart. It provides your doctor with information about the size and shape of your heart and how well your heart's chambers and valves are working. This procedure takes approximately one hour. There are no restrictions for this procedure. There is a possibility that an IV may need to be started during your test to inject an image enhancing agent. This is done to obtain more optimal pictures of your heart. Therefore we ask that you do at least drink some water prior to coming in to hydrate your veins.   Please do NOT wear cologne, perfume, aftershave, or lotions (deodorant is allowed). Please arrive 15 minutes prior to your appointment time.    Follow-Up: At [facility], you and your health needs are our priority.  As part of our continuing mission to provide you with exceptional heart care, we have created designated Provider Care Teams.  These Care Teams include your primary Cardiologist (physician) and Advanced Practice Providers (APPs -  Physician Assistants and Nurse Practitioners) who all work together to provide you with the care you need, when you need it.  You will need a follow up appointment in 4 months  Providers on your designated Care Team:   Murray Hodgkins, NP Christell Faith, PA-C Cadence Kathlen Mody, [place]  COVID-19 Vaccine Information can be found at: [email or web address] For questions related to vaccine distribution or appointments, please email [email or web address] or call [phone].    Echocardiogram An echocardiogram is a test that  uses sound waves (ultrasound) to produce images of the heart. Images from an echocardiogram can provide important information about: Heart size and shape. The size and thickness and movement of your heart's walls. Heart muscle function and strength. Heart valve function or if you have stenosis. Stenosis is when the heart valves are too narrow. If blood is flowing backward through the heart valves (regurgitation). A tumor or infectious growth around the heart valves. Areas of heart muscle that are not working well because of poor blood flow or injury from a heart attack. Aneurysm detection. An aneurysm is a weak or damaged part of an artery wall. The wall bulges out from the normal force of blood pumping through the body. Tell a health care provider about: Any allergies you have. All medicines you are taking, including vitamins, herbs, eye drops, creams, and over-the-counter medicines. Any blood disorders you have. Any surgeries you have had. Any medical conditions you have. Whether you are pregnant or may be pregnant. What are the risks? Generally, this is a safe test. However, problems may occur, including an allergic reaction to dye (contrast) that may be used during the test. What happens before the test? No specific preparation is needed. You may eat and drink normally. What happens during the test?  You will take off your clothes from the waist up and put on a hospital gown. Electrodes or electrocardiogram (ECG)patches may be placed on your chest. The electrodes or patches are then connected to a device that monitors your heart rate and rhythm. You will lie down on a table for an ultrasound exam. A gel will be applied to your chest to help sound waves pass through your skin.  A handheld device, called a transducer, will be pressed against your chest and moved over your heart. The transducer produces sound waves that travel to your heart and bounce back (or "echo" back) to the  transducer. These sound waves will be captured in real-time and changed into images of your heart that can be viewed on a video monitor. The images will be recorded on a computer and reviewed by your health care provider. You may be asked to change positions or hold your breath for a short time. This makes it easier to get different views or better views of your heart. In some cases, you may receive contrast through an IV in one of your veins. This can improve the quality of the pictures from your heart. The procedure may vary among health care providers and hospitals. What can I expect after the test? You may return to your normal, everyday life, including diet, activities, and medicines, unless your health care provider tells you not to do that. Follow these instructions at home: It is up to you to get the results of your test. Ask your health care provider, or the department that is doing the test, when your results will be ready. Keep all follow-up visits. This is important. Summary An echocardiogram is a test that uses sound waves (ultrasound) to produce images of the heart. Images from an echocardiogram can provide important information about the size and shape of your heart, heart muscle function, heart valve function, and other possible heart problems. You do not need to do anything to prepare before this test. You may eat and drink normally. After the echocardiogram is completed, you may return to your normal, everyday life, unless your health care provider tells you not to do that. This information is not intended to replace advice given to you by your health care provider. Make sure you discuss any questions you have with your health care provider. Document Revised: 05/15/2021 Document Reviewed: 04/24/2020 Elsevier Patient Education  [place].

## 2022-07-16 ENCOUNTER — Ambulatory Visit: Payer: No Typology Code available for payment source

## 2022-07-21 ENCOUNTER — Ambulatory Visit: Payer: No Typology Code available for payment source

## 2022-07-23 ENCOUNTER — Ambulatory Visit: Payer: No Typology Code available for payment source

## 2022-07-28 ENCOUNTER — Ambulatory Visit: Payer: No Typology Code available for payment source

## 2022-07-30 ENCOUNTER — Ambulatory Visit: Payer: No Typology Code available for payment source

## 2022-08-01 DIAGNOSIS — M545 Low back pain, unspecified: Secondary | ICD-10-CM | POA: Diagnosis not present

## 2022-08-04 ENCOUNTER — Ambulatory Visit: Payer: No Typology Code available for payment source

## 2022-08-05 DIAGNOSIS — M5116 Intervertebral disc disorders with radiculopathy, lumbar region: Secondary | ICD-10-CM | POA: Diagnosis not present

## 2022-08-05 DIAGNOSIS — I420 Dilated cardiomyopathy: Secondary | ICD-10-CM | POA: Diagnosis not present

## 2022-08-05 DIAGNOSIS — M5416 Radiculopathy, lumbar region: Secondary | ICD-10-CM | POA: Diagnosis not present

## 2022-08-06 ENCOUNTER — Ambulatory Visit: Payer: No Typology Code available for payment source

## 2022-08-11 ENCOUNTER — Ambulatory Visit: Payer: No Typology Code available for payment source

## 2022-08-13 ENCOUNTER — Ambulatory Visit: Payer: No Typology Code available for payment source

## 2022-08-18 ENCOUNTER — Ambulatory Visit: Payer: No Typology Code available for payment source

## 2022-08-20 ENCOUNTER — Ambulatory Visit: Payer: No Typology Code available for payment source

## 2022-08-25 ENCOUNTER — Ambulatory Visit: Payer: No Typology Code available for payment source

## 2022-08-27 ENCOUNTER — Ambulatory Visit: Payer: No Typology Code available for payment source

## 2022-09-01 ENCOUNTER — Ambulatory Visit: Payer: No Typology Code available for payment source

## 2022-09-03 ENCOUNTER — Ambulatory Visit: Payer: No Typology Code available for payment source

## 2022-09-10 ENCOUNTER — Ambulatory Visit: Payer: No Typology Code available for payment source

## 2022-09-14 ENCOUNTER — Encounter: Payer: Self-pay | Admitting: Cardiovascular Disease

## 2022-09-16 DIAGNOSIS — E538 Deficiency of other specified B group vitamins: Secondary | ICD-10-CM | POA: Diagnosis not present

## 2022-09-16 DIAGNOSIS — E782 Mixed hyperlipidemia: Secondary | ICD-10-CM | POA: Diagnosis not present

## 2022-09-17 ENCOUNTER — Other Ambulatory Visit: Payer: No Typology Code available for payment source

## 2022-09-17 DIAGNOSIS — E782 Mixed hyperlipidemia: Secondary | ICD-10-CM | POA: Diagnosis not present

## 2022-09-18 NOTE — Telephone Encounter (Signed)
Called and spoke with the patient. She stated that she had COVID a few weeks ago and felt poorly. She stated that she became weak and attributed this to the [facility]. She decided to stop taking it.  She stated that she feels better now and wanted to know if she should retry the [facility]. She started this originally in July without any problems. She will retry it and let us know if she becomes symptomatic.

## 2022-09-23 DIAGNOSIS — Z0000 Encounter for general adult medical examination without abnormal findings: Secondary | ICD-10-CM | POA: Diagnosis not present

## 2022-09-23 DIAGNOSIS — I420 Dilated cardiomyopathy: Secondary | ICD-10-CM | POA: Diagnosis not present

## 2022-09-23 DIAGNOSIS — I25118 Atherosclerotic heart disease of native coronary artery with other forms of angina pectoris: Secondary | ICD-10-CM | POA: Diagnosis not present

## 2022-09-23 DIAGNOSIS — E039 Hypothyroidism, unspecified: Secondary | ICD-10-CM | POA: Diagnosis not present

## 2022-09-23 DIAGNOSIS — E782 Mixed hyperlipidemia: Secondary | ICD-10-CM | POA: Diagnosis not present

## 2022-09-23 DIAGNOSIS — E538 Deficiency of other specified B group vitamins: Secondary | ICD-10-CM | POA: Diagnosis not present

## 2022-09-23 DIAGNOSIS — Z23 Encounter for immunization: Secondary | ICD-10-CM | POA: Diagnosis not present

## 2022-09-29 ENCOUNTER — Encounter: Payer: Self-pay | Admitting: *Deleted

## 2022-09-29 ENCOUNTER — Other Ambulatory Visit: Payer: Self-pay

## 2022-09-29 ENCOUNTER — Emergency Department: Payer: No Typology Code available for payment source

## 2022-09-29 ENCOUNTER — Emergency Department
Admission: EM | Admit: 2022-09-29 | Discharge: 2022-09-30 | Disposition: A | Discharge status: Home / Self Care | Payer: No Typology Code available for payment source | Attending: Emergency Medicine | Admitting: Emergency Medicine

## 2022-09-29 DIAGNOSIS — H8110 Benign paroxysmal vertigo, unspecified ear: Secondary | ICD-10-CM

## 2022-09-29 DIAGNOSIS — R42 Dizziness and giddiness: Secondary | ICD-10-CM | POA: Diagnosis not present

## 2022-09-29 DIAGNOSIS — Z1152 Encounter for screening for COVID-19: Secondary | ICD-10-CM | POA: Diagnosis not present

## 2022-09-29 DIAGNOSIS — I2510 Atherosclerotic heart disease of native coronary artery without angina pectoris: Secondary | ICD-10-CM | POA: Insufficient documentation

## 2022-09-29 DIAGNOSIS — R197 Diarrhea, unspecified: Secondary | ICD-10-CM | POA: Insufficient documentation

## 2022-09-29 DIAGNOSIS — E039 Hypothyroidism, unspecified: Secondary | ICD-10-CM | POA: Insufficient documentation

## 2022-09-29 DIAGNOSIS — R0902 Hypoxemia: Secondary | ICD-10-CM | POA: Diagnosis not present

## 2022-09-29 DIAGNOSIS — R110 Nausea: Secondary | ICD-10-CM | POA: Diagnosis not present

## 2022-09-29 DIAGNOSIS — H9190 Unspecified hearing loss, unspecified ear: Secondary | ICD-10-CM | POA: Diagnosis not present

## 2022-09-29 DIAGNOSIS — R1111 Vomiting without nausea: Secondary | ICD-10-CM | POA: Diagnosis not present

## 2022-09-29 LAB — URINALYSIS, ROUTINE W REFLEX MICROSCOPIC
Bilirubin Urine: NEGATIVE
Glucose, UA: NEGATIVE mg/dL
Hgb urine dipstick: NEGATIVE
Ketones, ur: NEGATIVE mg/dL
Nitrite: NEGATIVE
Protein, ur: NEGATIVE mg/dL
Specific Gravity, Urine: 1.013 (ref 1.005–1.030)
pH: 6 (ref 5.0–8.0)

## 2022-09-29 LAB — BASIC METABOLIC PANEL
Anion gap: 9 (ref 5–15)
BUN: 13 mg/dL (ref 8–23)
CO2: 23 mmol/L (ref 22–32)
Calcium: 9.2 mg/dL (ref 8.9–10.3)
Chloride: 108 mmol/L (ref 98–111)
Creatinine, Ser: 0.82 mg/dL (ref 0.44–1.00)
GFR, Estimated: >60 mL/min (ref >60)
Glucose, Bld: 126 mg/dL — ABNORMAL HIGH (ref 70–99)
Potassium: 3.5 mmol/L (ref 3.5–5.1)
Sodium: 140 mmol/L (ref 135–145)

## 2022-09-29 LAB — CBC
HCT: 41.1 % (ref 36.0–46.0)
Hemoglobin: 13.7 g/dL (ref 12.0–15.0)
MCH: 30.4 pg (ref 26.0–34.0)
MCHC: 33.3 g/dL (ref 30.0–36.0)
MCV: 91.3 fL (ref 80.0–100.0)
Platelets: 250 10*3/uL (ref 150–400)
RBC: 4.5 MIL/uL (ref 3.87–5.11)
RDW: 15.1 % (ref 11.5–15.5)
WBC: 7.4 10*3/uL (ref 4.0–10.5)
nRBC: 0 % (ref 0.0–0.2)

## 2022-09-29 LAB — TROPONIN I (HIGH SENSITIVITY)
Troponin I (High Sensitivity): 22 ng/L — ABNORMAL HIGH (ref <18)
Troponin I (High Sensitivity): 28 ng/L — ABNORMAL HIGH (ref <18)

## 2022-09-29 MED ORDER — SODIUM CHLORIDE 0.9 % IV BOLUS
1000.0000 mL | Freq: Once | INTRAVENOUS | Status: AC
  Administered 2022-09-29: 1000 mL via INTRAVENOUS

## 2022-09-29 MED ORDER — MECLIZINE HCL 25 MG PO TABS
25.0000 mg | ORAL_TABLET | Freq: Once | ORAL | Status: AC
  Administered 2022-09-29: 25 mg via ORAL
  Filled 2022-09-29: qty 1

## 2022-09-29 NOTE — ED Provider Notes (Signed)
[facility] Provider Note   Event Date/Time   First MD Initiated Contact with Patient 09/29/22 2114     (approximate) History  Dizziness  HPI Cindy Harper is a 77 y.o. female with a stated past medical history of hyperlipidemia, CAD, and hypothyroidism who presents for dizziness and vomiting that began upon awakening last night.  Patient states that she has also been having diarrhea for the past couple days.  EMS gave intramuscular Zofran the patient states that she has not had any vomiting since then.  Patient states that whenever she tries to sit up, the symptoms of lightheadedness worsen.  Patient states that it does not feel like the room is spinning around her however she does endorse positional changes in her lightheadedness.  Patient denies any loss of consciousness or head trauma. ROS: Patient currently denies any vision changes, tinnitus, difficulty speaking, facial droop, sore throat, chest pain, shortness of breath, abdominal pain, nausea/vomiting/diarrhea, dysuria, or weakness/numbness/paresthesias in any extremity   Physical Exam  Triage Vital Signs: ED Triage Vitals  Enc Vitals Group     BP 09/29/22 1948 108/81     Pulse Rate 09/29/22 1948 78     Resp 09/29/22 1948 20     Temp --      Temp Source 09/29/22 1948 Oral     SpO2 09/29/22 1929 93 %     Weight 09/29/22 1949 199 lb (90.3 kg)     Height 09/29/22 1949 5\' 5"  (1.651 m)     Head Circumference --      Peak Flow --      Pain Score 09/29/22 1949 0     Pain Loc --      Pain Edu? --      Excl. in [place]? --    Most recent vital signs: Vitals:   09/29/22 2327 09/30/22 0158  BP: 119/81 121/72  Pulse: 76 80  Resp: 18 14  Temp: 98.3 F (36.8 C)   SpO2: 97% 99%   General: Awake, oriented x4. CV:  Good peripheral perfusion.  Resp:  Normal effort.  Abd:  No distention.  Other:  Elderly overweight Caucasian female laying in stretcher in no acute distress.  Extinguishable nystagmus when  burying the eyes to the left.  Lightheadedness upon sitting up in stretcher ED Results / Procedures / Treatments  Labs (all labs ordered are listed, but only abnormal results are displayed) Labs Reviewed  BASIC METABOLIC PANEL - Abnormal; Notable for the following components:      Result Value   Glucose, Bld 126 (*)    All other components within normal limits  URINALYSIS, ROUTINE W REFLEX MICROSCOPIC - Abnormal; Notable for the following components:   Color, Urine YELLOW (*)    APPearance CLOUDY (*)    Leukocytes,Ua SMALL (*)    Bacteria, UA FEW (*)    All other components within normal limits  TROPONIN I (HIGH SENSITIVITY) - Abnormal; Notable for the following components:   Troponin I (High Sensitivity) 22 (*)    All other components within normal limits  TROPONIN I (HIGH SENSITIVITY) - Abnormal; Notable for the following components:   Troponin I (High Sensitivity) 28 (*)    All other components within normal limits  RESP PANEL BY RT-PCR (RSV, FLU A&B, COVID)  [id number]  CBC   EKG ED ECG REPORT I, Naaman Plummer, the attending physician, personally viewed and interpreted this ECG. Date: 09/29/2022 EKG Time: 1950 Rate: 72 Rhythm: normal sinus rhythm QRS  Axis: normal Intervals: normal ST/T Wave abnormalities: normal Narrative Interpretation: no evidence of acute ischemia RADIOLOGY ED MD interpretation: 2 view chest x-ray interpreted by me shows no evidence of acute abnormalities including no pneumonia, pneumothorax, or widened mediastinum -Agree with radiology assessment Official radiology report(s): MR BRAIN WO CONTRAST  Result Date: 09/30/2022 CLINICAL DATA:  Vertigo and left-sided hearing loss EXAM: MRI HEAD WITHOUT CONTRAST TECHNIQUE: Multiplanar, multiecho pulse sequences of the brain and surrounding structures were obtained without intravenous contrast. COMPARISON:  None Available. FINDINGS: Brain: No acute infarction, hemorrhage, hydrocephalus, extra-axial collection or  mass lesion. No chronic microhemorrhage. 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 There is multifocal hyperintense T2-weighted signal within the periventricular and deep white matter. Normal midline structures and CSF spaces. Vascular: Normal flow voids. Skull and upper cervical spine: Fluid signal collection in the right suboccipital scalp, likely inclusion cyst. Sinuses/Orbits: Negative. Other: None. IMPRESSION: 1. No acute intracranial abnormality. 2. 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 3. Findings of chronic microvascular ischemia. Electronically Signed   By: Ulyses Jarred M.D.   On: 09/30/2022 01:22   PROCEDURES: Critical Care performed: No Procedures MEDICATIONS ORDERED IN ED: Medications  sodium chloride 0.9 % bolus 1,000 mL (0 mLs Intravenous Stopped 09/29/22 2314)  meclizine (ANTIVERT) tablet 25 mg (25 mg Oral Given 09/29/22 2202)  diazepam (VALIUM) tablet 5 mg (5 mg Oral Given 09/30/22 0030)   IMPRESSION / MDM / ASSESSMENT AND PLAN / ED COURSE  I reviewed the triage vital signs and the nursing notes.                             The patient is on the cardiac monitor to evaluate for evidence of arrhythmia and/or significant heart rate changes. Patient's presentation is most consistent with acute presentation with potential threat to life or bodily function. Patient presents for acute nausea/vomiting The cause of the patients symptoms is not clear, but the patient is overall well appearing and is suspected to have a transient course of illness.  Given History and Exam there does not appear to be an emergent cause of the symptoms such as small bowel obstruction, coronary syndrome, bowel ischemia, DKA, pancreatitis, appendicitis, other acute abdomen or other emergent problem.  Reassessment: After treatment, the patient is feeling much better, tolerating PO fluids, and shows no signs of dehydration.   Disposition: Care of this patient will be signed out to  the oncoming physician at the end of my shift.  All pertinent patient information conveyed and all questions answered.  All further care and disposition decisions will be made by the oncoming physician.   FINAL CLINICAL IMPRESSION(S) / ED DIAGNOSES   Final diagnoses:  Dizziness  Vertigo  BPPV (benign paroxysmal positional vertigo), unspecified laterality   Rx / DC Orders   ED Discharge Orders          Ordered    meclizine (ANTIVERT) 25 MG tablet  3 times daily PRN        09/30/22 0135           Note:  This document was prepared using Dragon voice recognition software and may include unintentional dictation errors.   Naaman Plummer, MD 09/30/22 [phone]

## 2022-09-29 NOTE — ED Triage Notes (Signed)
EMS brings pt in from home for c/o dizziness accomp by N/V today

## 2022-09-29 NOTE — ED Triage Notes (Signed)
Pt reports dizziness and vomiting since last night.  Pt has diarrhea.  No abd pain  no chest pain.  EMS gave IM zofran.  Pt alert

## 2022-09-30 ENCOUNTER — Emergency Department: Payer: No Typology Code available for payment source

## 2022-09-30 DIAGNOSIS — R42 Dizziness and giddiness: Secondary | ICD-10-CM | POA: Diagnosis not present

## 2022-09-30 DIAGNOSIS — H9190 Unspecified hearing loss, unspecified ear: Secondary | ICD-10-CM | POA: Diagnosis not present

## 2022-09-30 LAB — RESP PANEL BY RT-PCR (RSV, FLU A&B, COVID)  RVPGX2
Influenza A by PCR: NEGATIVE
Influenza B by PCR: NEGATIVE
Resp Syncytial Virus by PCR: NEGATIVE
SARS Coronavirus 2 by RT PCR: NEGATIVE

## 2022-09-30 MED ORDER — DIAZEPAM 5 MG PO TABS
5.0000 mg | ORAL_TABLET | Freq: Once | ORAL | Status: AC
  Administered 2022-09-30: 5 mg via ORAL
  Filled 2022-09-30: qty 1

## 2022-09-30 MED ORDER — MECLIZINE HCL 25 MG PO TABS: 25.0000 mg | ORAL_TABLET | Freq: Three times a day (TID) | ORAL | 0 refills | Status: DC | PRN

## 2022-09-30 NOTE — ED Provider Notes (Signed)
Patient received in signout from Dr. Cheri Fowler pending reassessment for positional vertigo thought to be peripheral in etiology.  I reevaluate the patient and she reports concern that her dizziness has not subsided despite meclizine and IV fluids.  Furthermore, she reports like her hearing out of the left ear is lesser than normal.  I visualized a normal TM without signs of cerumen impaction or perforation.  Troponins are flat and she has no chest pain.    After discussing risks and benefits, she would like to go ahead with an MRI brain, which we do.  This does not show any signs of acute intracranial pathology.  Provided prescription for meclizine and urged her to follow-up with ENT as an outpatient.  We discussed close return precautions.  She is suitable for outpatient management.   Vladimir Crofts, MD 09/30/22 [phone]

## 2022-09-30 NOTE — Discharge Instructions (Addendum)
Please follow-up with the ENT doctors in the clinic  You are being discharged with a prescription for meclizine/Antivert medicine to try up to 3 times/day as needed for vertigo.  If your symptoms get worse such that you have fevers with your symptoms, dizziness that will not go away even if you sit still, please return to the ED.

## 2022-09-30 NOTE — ED Notes (Signed)
Patient transported to MRI 

## 2022-10-01 DIAGNOSIS — H8102 Meniere's disease, left ear: Secondary | ICD-10-CM | POA: Diagnosis not present

## 2022-10-01 DIAGNOSIS — H8112 Benign paroxysmal vertigo, left ear: Secondary | ICD-10-CM | POA: Diagnosis not present

## 2022-10-08 DIAGNOSIS — H8110 Benign paroxysmal vertigo, unspecified ear: Secondary | ICD-10-CM | POA: Diagnosis not present

## 2022-10-08 DIAGNOSIS — H8102 Meniere's disease, left ear: Secondary | ICD-10-CM | POA: Diagnosis not present

## 2022-10-29 ENCOUNTER — Ambulatory Visit: Payer: No Typology Code available for payment source

## 2022-10-29 DIAGNOSIS — H903 Sensorineural hearing loss, bilateral: Secondary | ICD-10-CM | POA: Diagnosis not present

## 2022-10-29 DIAGNOSIS — R42 Dizziness and giddiness: Secondary | ICD-10-CM | POA: Diagnosis not present

## 2022-10-29 DIAGNOSIS — H90A22 Sensorineural hearing loss, unilateral, left ear, with restricted hearing on the contralateral side: Secondary | ICD-10-CM | POA: Diagnosis not present

## 2022-11-05 DIAGNOSIS — H2513 Age-related nuclear cataract, bilateral: Secondary | ICD-10-CM | POA: Diagnosis not present

## 2022-11-17 ENCOUNTER — Ambulatory Visit: Payer: No Typology Code available for payment source | Admitting: Cardiovascular Disease

## 2022-11-19 DIAGNOSIS — L508 Other urticaria: Secondary | ICD-10-CM | POA: Diagnosis not present

## 2022-12-02 DIAGNOSIS — H90A22 Sensorineural hearing loss, unilateral, left ear, with restricted hearing on the contralateral side: Secondary | ICD-10-CM | POA: Diagnosis not present

## 2022-12-02 DIAGNOSIS — R42 Dizziness and giddiness: Secondary | ICD-10-CM | POA: Diagnosis not present

## 2022-12-03 DIAGNOSIS — Z79899 Other long term (current) drug therapy: Secondary | ICD-10-CM | POA: Diagnosis not present

## 2022-12-16 ENCOUNTER — Ambulatory Visit: Payer: No Typology Code available for payment source | Attending: Cardiovascular Disease

## 2022-12-16 DIAGNOSIS — I420 Dilated cardiomyopathy: Secondary | ICD-10-CM | POA: Diagnosis not present

## 2022-12-16 LAB — ECHOCARDIOGRAM COMPLETE
AR max vel: 2.55 cm2
AV Area VTI: 2.42 cm2
AV Area mean vel: 2.54 cm2
AV Mean grad: 3 mmHg
AV Peak grad: 5.5 mmHg
Ao pk vel: 1.18 m/s
Area-P 1/2: 2.91 cm2
Calc EF: 43.3 %
MV VTI: 1.37 cm2
S' Lateral: 4.4 cm
Single Plane A2C EF: 43.3 %
Single Plane A4C EF: 43 %

## 2022-12-19 DIAGNOSIS — L814 Other melanin hyperpigmentation: Secondary | ICD-10-CM | POA: Diagnosis not present

## 2022-12-19 DIAGNOSIS — L568 Other specified acute skin changes due to ultraviolet radiation: Secondary | ICD-10-CM | POA: Diagnosis not present

## 2022-12-19 DIAGNOSIS — L821 Other seborrheic keratosis: Secondary | ICD-10-CM | POA: Diagnosis not present

## 2023-01-12 ENCOUNTER — Ambulatory Visit
Payer: No Typology Code available for payment source | Attending: Cardiovascular Disease | Admitting: Cardiovascular Disease

## 2023-01-12 ENCOUNTER — Encounter: Payer: Self-pay | Admitting: Cardiovascular Disease

## 2023-01-12 VITALS — BP 110/60 | HR 66 | Ht 66.0 in | Wt 182.0 lb

## 2023-01-12 DIAGNOSIS — E782 Mixed hyperlipidemia: Secondary | ICD-10-CM | POA: Diagnosis not present

## 2023-01-12 DIAGNOSIS — I25118 Atherosclerotic heart disease of native coronary artery with other forms of angina pectoris: Secondary | ICD-10-CM | POA: Diagnosis not present

## 2023-01-12 DIAGNOSIS — I420 Dilated cardiomyopathy: Secondary | ICD-10-CM | POA: Diagnosis not present

## 2023-01-12 MED ORDER — POTASSIUM CHLORIDE ER 10 MEQ PO TBCR: 10.0000 meq | EXTENDED_RELEASE_TABLET | Freq: Every day | ORAL | 3 refills | Status: DC

## 2023-01-12 NOTE — Progress Notes (Signed)
Cardiology Office Note  Date:  01/12/2023   ID:  Cindy, Harper 1946-08-07, MRN [id number]  PCP:  Danella Penton, MD   Chief Complaint  Patient presents with   4 month follow up     Patient c/o dizziness at times; no more than usual, has a red rash on hands & arms. Medications reviewed by the patient verbally.     HPI:  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 Echo with EF 40 to 45% in April 2024 Who presents for f/u of her nonischemic cardiomyopathy  Last seen by myself in clinic October 2023 Presents today with her daughter In follow-up today reports doing well overall Continues to have periodic episodes of orthostasis/dizziness, usually when sitting up from supine position  Currently not on Entresto Concerned about some hair feels thin Some dizziness, orthostasis Potassium trending lower on HCTZ  Not on losartan Reports that 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  Active, no regular exercise program  Echocardiogram performed last month ejection fraction improving 40 to 45%  EKG personally reviewed by myself on todays visit Normal sinus rhythm rate 78 bpm left axis deviation no significant ST-T wave changes  Prior cardiac history reviewed Previously followed by [facility] cardiology Reports getting overstressed while at [facility] 2005 leading to shortness of breath episodes, seen in the hospital in [place] for non-STEMI, diagnosed with nonobstructive coronary disease, records not available  Reviewed echocardiograms from 2019 and 2023 detailing ejection fraction 35% Stress test performed 2019 with no significant ischemia, fixed inferior wall defect  Cath 2019- No evidence of ischemia on ETT by electrocardiogram. EF 50%. Fixed inferior defect consistent with scar with no significant reversible ischemia. Clinical correlation  recommended.  Echo 2019- MODERATE LV SYSTOLIC DYSFUNCTION (See above) NORMAL RIGHT VENTRICULAR SYSTOLIC FUNCTION MILD VALVULAR REGURGITATION (See above) NO VALVULAR STENOSIS Moderate global Lv dysfunction with focal distal septal dyskinesis  Echo 12/2021- MODERATE LV SYSTOLIC DYSFUNCTION (See above) WITH MILD LVH NORMAL RIGHT VENTRICULAR SYSTOLIC FUNCTION MILD VALVULAR REGURGITATION (See above) NO VALVULAR STENOSIS MILD MR EF 35%  2019 Myoview results, which showed an EF of 50%, fixed inferior defect consistent with scar, with no reversible ischemia.  PMH:   has a past medical history of Arthritis, B12 deficiency, CAD (coronary artery disease), DCM (dilated cardiomyopathy) (HCC), Dyspnea, GERD (gastroesophageal reflux disease), HLD (hyperlipidemia), Hypothyroidism, and Non Q wave myocardial infarction (HCC) (2005).  PSH:    Past Surgical History:  Procedure Laterality Date   ABDOMINAL HYSTERECTOMY     CARDIAC CATHETERIZATION N/A 2005   KNEE ARTHROPLASTY Left 07/09/2016   Procedure: COMPUTER ASSISTED TOTAL KNEE ARTHROPLASTY;  Surgeon: Donato Heinz, MD;  Location: [facility] ORS;  Service: Orthopedics;  Laterality: Left;   KNEE ARTHROPLASTY Right 03/04/2021   Procedure: COMPUTER ASSISTED TOTAL KNEE ARTHROPLASTY;  Surgeon: Donato Heinz, MD;  Location: [facility] ORS;  Service: Orthopedics;  Laterality: Right;    Current Outpatient Medications  Medication Sig Dispense Refill   acetaminophen (TYLENOL) 500 MG tablet Take 500-1,000 mg by mouth every 6 (six) hours as needed for mild pain or headache.     aspirin 81 MG chewable tablet Chew 81 mg by mouth daily.     Cyanocobalamin (VITAMIN B 12 PO) Take by mouth once a week.     fluticasone (FLONASE) 50 MCG/ACT nasal spray Place 1 spray into both nostrils daily.  hydrochlorothiazide (HYDRODIURIL) 12.5 MG tablet Take 12.5 mg by mouth daily.     ibuprofen (ADVIL) 200 MG tablet Take 200 mg by mouth every 6 (six) hours as needed for moderate pain  or headache.     Lactobacillus (PROBIOTIC ACIDOPHILUS PO) Take by mouth daily.     levothyroxine (SYNTHROID) 88 MCG tablet Take 88 mcg by mouth daily before breakfast.     meclizine (ANTIVERT) 25 MG tablet Take 1 tablet (25 mg total) by mouth 3 (three) times daily as needed for dizziness. 30 tablet 0   Multiple Vitamin (MULTIVITAMIN WITH MINERALS) TABS tablet Take 1 tablet by mouth daily.     sertraline (ZOLOFT) 25 MG tablet Take 1 tablet by mouth daily.     celecoxib (CELEBREX) 200 MG capsule Take 1 capsule (200 mg total) by mouth 2 (two) times daily. (Patient not taking: Reported on 01/12/2023) 90 capsule 0   ondansetron (ZOFRAN-ODT) 4 MG disintegrating tablet Take 1 tablet (4 mg total) by mouth every 8 (eight) hours as needed for nausea or vomiting. (Patient not taking: Reported on 04/08/2022) 10 tablet 0   traMADol (ULTRAM) 50 MG tablet Take 1 tablet (50 mg total) by mouth every 4 (four) hours as needed for moderate pain. (Patient not taking: Reported on 04/08/2022) 30 tablet 0   No current facility-administered medications for this visit.     Allergies:   Ezetimibe-simvastatin   Social History:  The patient  reports that she has never smoked. She has never used smokeless tobacco. She reports that she does not drink alcohol and does not use drugs.   Family History:   family history includes Breast cancer in her maternal aunt; Breast cancer (age of onset: 68) in her mother; Kidney disease in her father.    Review of Systems: Review of Systems  Constitutional: Negative.   HENT: Negative.    Respiratory: Negative.    Cardiovascular: Negative.   Gastrointestinal: Negative.   Musculoskeletal: Negative.   Neurological: Negative.   Psychiatric/Behavioral: Negative.    All other systems reviewed and are negative.    PHYSICAL EXAM: VS:  BP 110/60 (BP Location: Left Arm, Patient Position: Sitting, Cuff Size: Normal)   Pulse 66   Ht 5\' 6"  (1.676 m)   Wt 182 lb (82.6 kg)   SpO2 97%    BMI 29.38 kg/m  , BMI Body mass index is 29.38 kg/m. Constitutional:  oriented to person, place, and time. No distress.  HENT:  Head: Grossly normal Eyes:  no discharge. No scleral icterus.  Neck: No JVD, no carotid bruits  Cardiovascular: Regular rate and rhythm, no murmurs appreciated Pulmonary/Chest: Clear to auscultation bilaterally, no wheezes or rails Abdominal: Soft.  no distension.  no tenderness.  Musculoskeletal: Normal range of motion Neurological:  normal muscle tone. Coordination normal. No atrophy Skin: Skin warm and dry Psychiatric: normal affect, pleasant  Recent Labs: 09/29/2022: BUN 13; Creatinine, Ser 0.82; Hemoglobin 13.7; Platelets 250; Potassium 3.5; Sodium 140    Lipid Panel No results found for: "CHOL", "HDL", "LDLCALC", "TRIG"    Wt Readings from Last 3 Encounters:  01/12/23 182 lb (82.6 kg)  09/29/22 199 lb (90.3 kg)  07/14/22 199 lb (90.3 kg)       ASSESSMENT AND PLAN:  Problem List Items Addressed This Visit       Cardiology Problems   CAD (coronary artery disease), native coronary artery - Primary   Relevant Medications   hydrochlorothiazide (HYDRODIURIL) 12.5 MG tablet   Cardiomyopathy (HCC)   Relevant Medications  hydrochlorothiazide (HYDRODIURIL) 12.5 MG tablet   Other Visit Diagnoses     Mixed hyperlipidemia       Relevant Medications   hydrochlorothiazide (HYDRODIURIL) 12.5 MG tablet     Dilated cardiomyopathy Ejection fraction 35% in 2019, ejection fraction in April 2023 EF 35% Reports having side effects on Entresto Having symptoms of mild orthostasis Discussed possibly starting losartan 12.5 daily, other alternative includes spironolactone 12.5 daily, even Jardiance which does not drop blood pressure After long discussion, we will not make any medication changes at this time  Coronary disease with stable angina Prior catheterization 2005 nonobstructive disease Stress test 2019 fixed inferior wall defect, no cardiac  catheterization performed at that time  On aspirin, not on beta-blocker secondary to bradycardia,  Unable to afford cardiac rehab  Hyperlipidemia total cholesterol above 200 Currently not on a statin    Total encounter time more than 30 minutes  Greater than 50% was spent in counseling and coordination of care with the patient    Signed, Dossie Arbour, M.D., Ph.D. [facility] [place], [place] [phone]

## 2023-01-12 NOTE — Patient Instructions (Addendum)
Medication Instructions:  Potassium one a day  If you need a refill on your cardiac medications before your next appointment, please call your pharmacy.   Lab work: No new labs needed  Testing/Procedures: No new testing needed  Follow-Up: At [facility], you and your health needs are our priority.  As part of our continuing mission to provide you with exceptional heart care, we have created designated Provider Care Teams.  These Care Teams include your primary Cardiologist (physician) and Advanced Practice Providers (APPs -  Physician Assistants and Nurse Practitioners) who all work together to provide you with the care you need, when you need it.  You will need a follow up appointment in 12 months  Providers on your designated Care Team:   Nicolasa Ducking, NP Eula Listen, PA-C Cadence Fransico Michael, [place]  COVID-19 Vaccine Information can be found at: [email or web address] For questions related to vaccine distribution or appointments, please email [email or web address] or call [phone].

## 2023-01-19 DIAGNOSIS — R21 Rash and other nonspecific skin eruption: Secondary | ICD-10-CM | POA: Diagnosis not present

## 2023-01-19 DIAGNOSIS — F430 Acute stress reaction: Secondary | ICD-10-CM | POA: Diagnosis not present

## 2023-01-26 IMAGING — DX DG KNEE 1-2V PORT*R*
2 series · 2 of 2 positions shown · non-contrast
Comparison: None.

CLINICAL DATA: Postoperative right knee, status post total
arthroplasty

EXAM:
PORTABLE RIGHT KNEE - 1-2 VIEW

[knee ap]
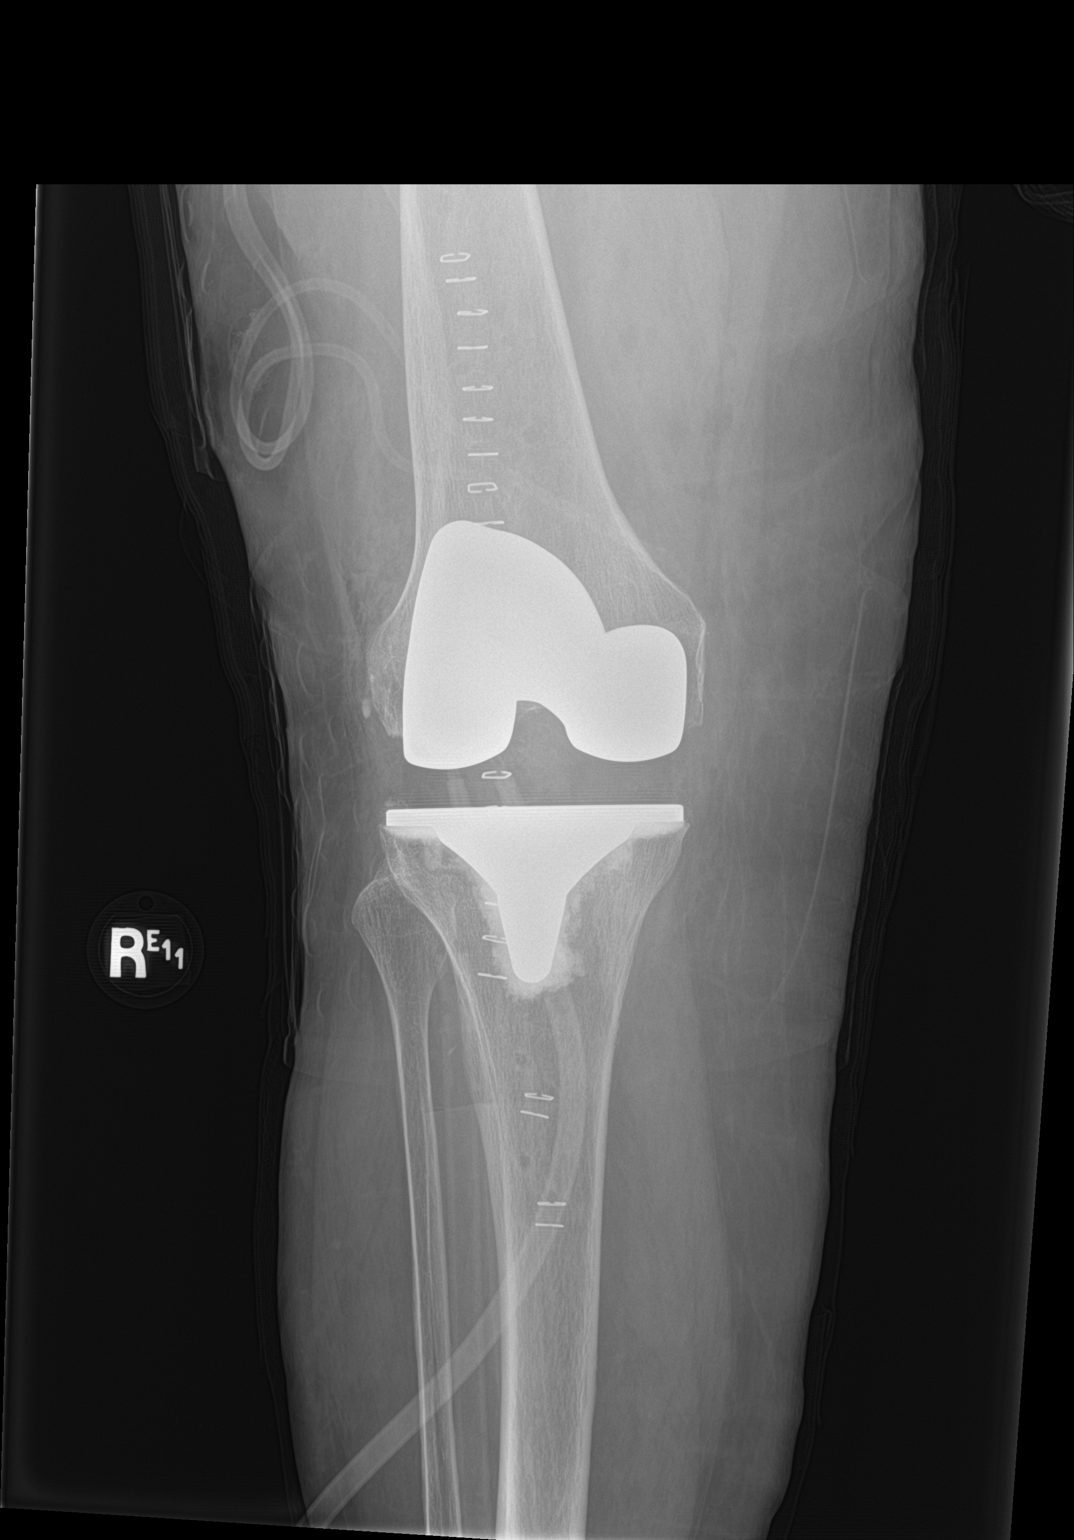

[knee obl]
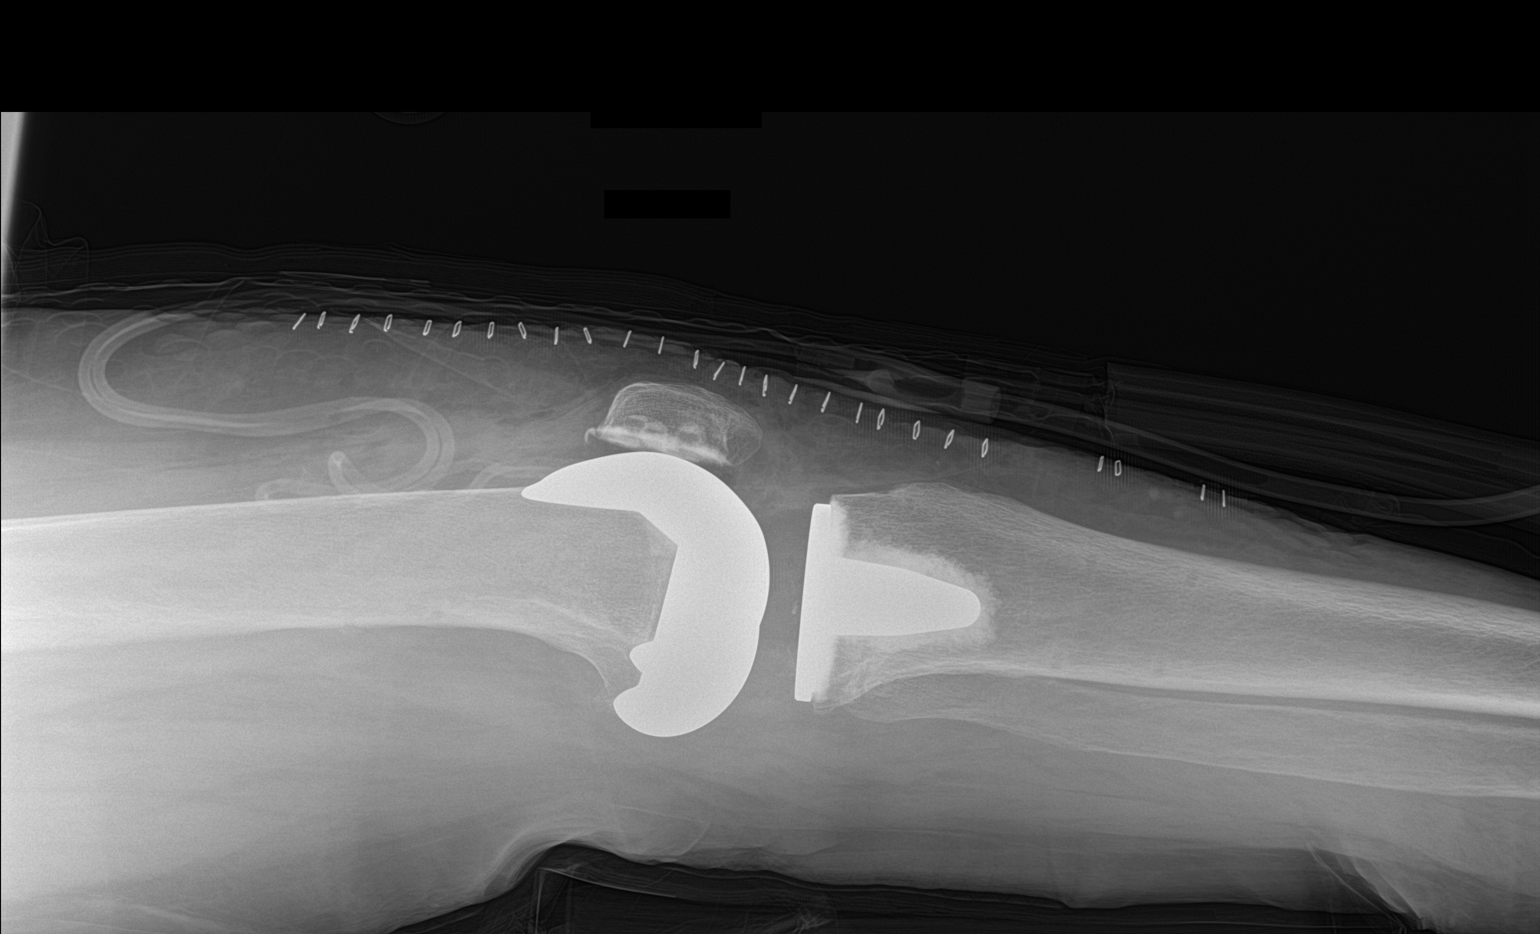

[2 of 2 positions shown; findings below may reference images not displayed]

FINDINGS: Status post right knee total arthroplasty with expected overlying
postoperative changes. No evidence of perihardware fracture or
component malpositioning.
IMPRESSION: Status post right knee total arthroplasty with expected overlying
postoperative changes. No evidence of perihardware fracture or
component malpositioning.

## 2023-02-10 DIAGNOSIS — H903 Sensorineural hearing loss, bilateral: Secondary | ICD-10-CM | POA: Diagnosis not present

## 2023-03-03 DIAGNOSIS — Z96651 Presence of right artificial knee joint: Secondary | ICD-10-CM | POA: Diagnosis not present

## 2023-03-05 DIAGNOSIS — H903 Sensorineural hearing loss, bilateral: Secondary | ICD-10-CM | POA: Diagnosis not present

## 2023-03-20 DIAGNOSIS — E538 Deficiency of other specified B group vitamins: Secondary | ICD-10-CM | POA: Diagnosis not present

## 2023-03-20 DIAGNOSIS — E782 Mixed hyperlipidemia: Secondary | ICD-10-CM | POA: Diagnosis not present

## 2023-04-01 ENCOUNTER — Ambulatory Visit
Admission: RE | Admit: 2023-04-01 | Discharge: 2023-04-01 | Disposition: A | Discharge status: Home / Self Care | Payer: No Typology Code available for payment source | Source: Ambulatory Visit | Attending: Internal Medicine | Admitting: Internal Medicine

## 2023-04-01 ENCOUNTER — Other Ambulatory Visit: Payer: Self-pay | Admitting: Internal Medicine

## 2023-04-01 DIAGNOSIS — E782 Mixed hyperlipidemia: Secondary | ICD-10-CM | POA: Diagnosis not present

## 2023-04-01 DIAGNOSIS — I639 Cerebral infarction, unspecified: Secondary | ICD-10-CM | POA: Insufficient documentation

## 2023-04-01 DIAGNOSIS — E538 Deficiency of other specified B group vitamins: Secondary | ICD-10-CM | POA: Diagnosis not present

## 2023-04-01 DIAGNOSIS — Z0000 Encounter for general adult medical examination without abnormal findings: Secondary | ICD-10-CM | POA: Diagnosis not present

## 2023-04-01 DIAGNOSIS — I420 Dilated cardiomyopathy: Secondary | ICD-10-CM | POA: Diagnosis not present

## 2023-04-02 DIAGNOSIS — G510 Bell's palsy: Secondary | ICD-10-CM | POA: Diagnosis not present

## 2023-04-02 DIAGNOSIS — Z79899 Other long term (current) drug therapy: Secondary | ICD-10-CM | POA: Diagnosis not present

## 2023-04-02 DIAGNOSIS — I110 Hypertensive heart disease with heart failure: Secondary | ICD-10-CM | POA: Diagnosis not present

## 2023-04-02 DIAGNOSIS — H5711 Ocular pain, right eye: Secondary | ICD-10-CM | POA: Diagnosis not present

## 2023-04-02 DIAGNOSIS — H4920 Sixth [abducent] nerve palsy, unspecified eye: Secondary | ICD-10-CM | POA: Diagnosis not present

## 2023-04-02 DIAGNOSIS — H189 Unspecified disorder of cornea: Secondary | ICD-10-CM | POA: Diagnosis not present

## 2023-04-02 DIAGNOSIS — H5000 Unspecified esotropia: Secondary | ICD-10-CM | POA: Diagnosis not present

## 2023-04-02 DIAGNOSIS — E039 Hypothyroidism, unspecified: Secondary | ICD-10-CM | POA: Diagnosis not present

## 2023-04-02 DIAGNOSIS — E222 Syndrome of inappropriate secretion of antidiuretic hormone: Secondary | ICD-10-CM | POA: Diagnosis not present

## 2023-04-02 DIAGNOSIS — R9402 Abnormal brain scan: Secondary | ICD-10-CM | POA: Diagnosis not present

## 2023-04-02 DIAGNOSIS — E861 Hypovolemia: Secondary | ICD-10-CM | POA: Diagnosis not present

## 2023-04-02 DIAGNOSIS — R930 Abnormal findings on diagnostic imaging of skull and head, not elsewhere classified: Secondary | ICD-10-CM | POA: Diagnosis not present

## 2023-04-02 DIAGNOSIS — I639 Cerebral infarction, unspecified: Secondary | ICD-10-CM | POA: Diagnosis not present

## 2023-04-02 DIAGNOSIS — I429 Cardiomyopathy, unspecified: Secondary | ICD-10-CM | POA: Diagnosis not present

## 2023-04-02 DIAGNOSIS — R9089 Other abnormal findings on diagnostic imaging of central nervous system: Secondary | ICD-10-CM | POA: Diagnosis not present

## 2023-04-02 DIAGNOSIS — I252 Old myocardial infarction: Secondary | ICD-10-CM | POA: Diagnosis not present

## 2023-04-02 DIAGNOSIS — C8339 Diffuse large B-cell lymphoma, extranodal and solid organ sites: Secondary | ICD-10-CM | POA: Diagnosis not present

## 2023-04-02 DIAGNOSIS — I2510 Atherosclerotic heart disease of native coronary artery without angina pectoris: Secondary | ICD-10-CM | POA: Diagnosis not present

## 2023-04-02 DIAGNOSIS — E230 Hypopituitarism: Secondary | ICD-10-CM | POA: Diagnosis not present

## 2023-04-02 DIAGNOSIS — E233 Hypothalamic dysfunction, not elsewhere classified: Secondary | ICD-10-CM | POA: Diagnosis not present

## 2023-04-02 DIAGNOSIS — H02206 Unspecified lagophthalmos left eye, unspecified eyelid: Secondary | ICD-10-CM | POA: Diagnosis not present

## 2023-04-02 DIAGNOSIS — R42 Dizziness and giddiness: Secondary | ICD-10-CM | POA: Diagnosis not present

## 2023-04-02 DIAGNOSIS — E2749 Other adrenocortical insufficiency: Secondary | ICD-10-CM | POA: Diagnosis not present

## 2023-04-02 DIAGNOSIS — H9192 Unspecified hearing loss, left ear: Secondary | ICD-10-CM | POA: Diagnosis not present

## 2023-04-02 DIAGNOSIS — Z8679 Personal history of other diseases of the circulatory system: Secondary | ICD-10-CM | POA: Diagnosis not present

## 2023-04-02 DIAGNOSIS — I959 Hypotension, unspecified: Secondary | ICD-10-CM | POA: Diagnosis not present

## 2023-04-02 DIAGNOSIS — I5022 Chronic systolic (congestive) heart failure: Secondary | ICD-10-CM | POA: Diagnosis not present

## 2023-04-02 DIAGNOSIS — R110 Nausea: Secondary | ICD-10-CM | POA: Diagnosis not present

## 2023-04-02 DIAGNOSIS — H9193 Unspecified hearing loss, bilateral: Secondary | ICD-10-CM | POA: Diagnosis not present

## 2023-04-02 DIAGNOSIS — E221 Hyperprolactinemia: Secondary | ICD-10-CM | POA: Diagnosis not present

## 2023-04-02 DIAGNOSIS — R29810 Facial weakness: Secondary | ICD-10-CM | POA: Diagnosis not present

## 2023-04-02 DIAGNOSIS — E0590 Thyrotoxicosis, unspecified without thyrotoxic crisis or storm: Secondary | ICD-10-CM | POA: Diagnosis not present

## 2023-04-02 DIAGNOSIS — G629 Polyneuropathy, unspecified: Secondary | ICD-10-CM | POA: Diagnosis not present

## 2023-04-02 DIAGNOSIS — Z8673 Personal history of transient ischemic attack (TIA), and cerebral infarction without residual deficits: Secondary | ICD-10-CM | POA: Diagnosis not present

## 2023-04-02 DIAGNOSIS — C8330 Diffuse large B-cell lymphoma, unspecified site: Secondary | ICD-10-CM | POA: Diagnosis not present

## 2023-04-02 DIAGNOSIS — H532 Diplopia: Secondary | ICD-10-CM | POA: Diagnosis not present

## 2023-04-03 DIAGNOSIS — R161 Splenomegaly, not elsewhere classified: Secondary | ICD-10-CM | POA: Diagnosis not present

## 2023-04-03 DIAGNOSIS — R911 Solitary pulmonary nodule: Secondary | ICD-10-CM | POA: Diagnosis not present

## 2023-04-03 DIAGNOSIS — R930 Abnormal findings on diagnostic imaging of skull and head, not elsewhere classified: Secondary | ICD-10-CM | POA: Diagnosis not present

## 2023-04-03 DIAGNOSIS — R9089 Other abnormal findings on diagnostic imaging of central nervous system: Secondary | ICD-10-CM | POA: Diagnosis not present

## 2023-04-03 DIAGNOSIS — C8339 Diffuse large B-cell lymphoma, extranodal and solid organ sites: Secondary | ICD-10-CM | POA: Diagnosis not present

## 2023-04-05 DIAGNOSIS — H532 Diplopia: Secondary | ICD-10-CM | POA: Diagnosis not present

## 2023-04-05 DIAGNOSIS — E039 Hypothyroidism, unspecified: Secondary | ICD-10-CM | POA: Diagnosis not present

## 2023-04-05 DIAGNOSIS — E237 Disorder of pituitary gland, unspecified: Secondary | ICD-10-CM | POA: Diagnosis not present

## 2023-04-05 DIAGNOSIS — R930 Abnormal findings on diagnostic imaging of skull and head, not elsewhere classified: Secondary | ICD-10-CM | POA: Diagnosis not present

## 2023-04-05 DIAGNOSIS — E222 Syndrome of inappropriate secretion of antidiuretic hormone: Secondary | ICD-10-CM | POA: Diagnosis not present

## 2023-04-05 DIAGNOSIS — E2749 Other adrenocortical insufficiency: Secondary | ICD-10-CM | POA: Diagnosis not present

## 2023-04-06 DIAGNOSIS — R9089 Other abnormal findings on diagnostic imaging of central nervous system: Secondary | ICD-10-CM | POA: Diagnosis not present

## 2023-04-06 DIAGNOSIS — R930 Abnormal findings on diagnostic imaging of skull and head, not elsewhere classified: Secondary | ICD-10-CM | POA: Diagnosis not present

## 2023-04-07 DIAGNOSIS — R930 Abnormal findings on diagnostic imaging of skull and head, not elsewhere classified: Secondary | ICD-10-CM | POA: Diagnosis not present

## 2023-04-07 DIAGNOSIS — M899 Disorder of bone, unspecified: Secondary | ICD-10-CM | POA: Diagnosis not present

## 2023-04-07 DIAGNOSIS — R161 Splenomegaly, not elsewhere classified: Secondary | ICD-10-CM | POA: Diagnosis not present

## 2023-04-08 DIAGNOSIS — C8339 Diffuse large B-cell lymphoma, extranodal and solid organ sites: Secondary | ICD-10-CM | POA: Diagnosis not present

## 2023-04-08 DIAGNOSIS — R222 Localized swelling, mass and lump, trunk: Secondary | ICD-10-CM | POA: Diagnosis not present

## 2023-04-10 DIAGNOSIS — R930 Abnormal findings on diagnostic imaging of skull and head, not elsewhere classified: Secondary | ICD-10-CM | POA: Diagnosis not present

## 2023-04-11 DIAGNOSIS — C8339 Diffuse large B-cell lymphoma, extranodal and solid organ sites: Secondary | ICD-10-CM | POA: Diagnosis not present

## 2023-04-11 DIAGNOSIS — C8331 Diffuse large B-cell lymphoma, lymph nodes of head, face, and neck: Secondary | ICD-10-CM | POA: Diagnosis not present

## 2023-04-11 DIAGNOSIS — R930 Abnormal findings on diagnostic imaging of skull and head, not elsewhere classified: Secondary | ICD-10-CM | POA: Diagnosis not present

## 2023-04-11 DIAGNOSIS — G527 Disorders of multiple cranial nerves: Secondary | ICD-10-CM | POA: Diagnosis not present

## 2023-04-12 DIAGNOSIS — I422 Other hypertrophic cardiomyopathy: Secondary | ICD-10-CM | POA: Diagnosis not present

## 2023-04-12 DIAGNOSIS — R930 Abnormal findings on diagnostic imaging of skull and head, not elsewhere classified: Secondary | ICD-10-CM | POA: Diagnosis not present

## 2023-04-12 DIAGNOSIS — R9089 Other abnormal findings on diagnostic imaging of central nervous system: Secondary | ICD-10-CM | POA: Diagnosis not present

## 2023-04-12 DIAGNOSIS — C8330 Diffuse large B-cell lymphoma, unspecified site: Secondary | ICD-10-CM | POA: Diagnosis not present

## 2023-04-12 DIAGNOSIS — G527 Disorders of multiple cranial nerves: Secondary | ICD-10-CM | POA: Diagnosis not present

## 2023-04-12 DIAGNOSIS — C8331 Diffuse large B-cell lymphoma, lymph nodes of head, face, and neck: Secondary | ICD-10-CM | POA: Diagnosis not present

## 2023-04-15 DIAGNOSIS — C8599 Non-Hodgkin lymphoma, unspecified, extranodal and solid organ sites: Secondary | ICD-10-CM | POA: Insufficient documentation

## 2023-04-15 NOTE — Progress Notes (Unsigned)
[facility] CONSULT NOTE  Patient Care Team: Danella Penton, MD as PCP - General (Internal Medicine) Earna Coder, MD as Consulting Physician (Oncology) Carmina Miller, MD as Consulting Physician (Radiation Oncology)  CHIEF COMPLAINTS/PURPOSE OF CONSULTATION: Primary CNS lymphoma  Oncology History Overview Note  PET Impression: 1. Hypermetabolic activity above and below the diaphragm as detailed below, findings are nonspecific but concerning for lymphoma or sarcoidosis. 2. Focal radiotracer uptake within the brain at the level of the left medial thalamus and hypothalamus, correlating with findings on April 02, 2023 MRI brain. Radiotracer uptake within the right optic nerve and left medial rectus muscle which is nonspecific.  3. Hypermetabolic soft tissue nodule within the left chest wall between the latissimus dorsi and intercostal musculature (series 12: Image 137) that would be amenable to biopsy. 4. Hypermetabolic splenic mass and lytic L2 vertebral body lesion which could be sites of metastatic disease.  5. The 1 mm right lower lobe lung nodule previously seen on April 03, 2023 CT chest abdomen pelvis appears unchanged and is nonhypermetabolic.  Other Diagnostic Studies:  Soft tissue biopsy (04/08/2023):  A. Chest wall, left lateral mass, needle core biopsy:  Diffuse large B-cell lymphoma (DLBCL), germinal center B-cell subtype. See comment.  Comment: The immunophenotype of the neoplastic cells (CD10+, BCL6+, MUM1-) is consistent with a germinal center B-cell subtype per the [facility] algorithm. EBER stains and FISH studies for MYC, BCL2 and BCL6 rearrangements are pending to rule out EBV-related DLBCL and high grade B-cell lymphoma ("double/triple hit lymphoma"), respectively. These will be reported separately.  Microscopic and ancillary findings: Smears show large atypical cells. The needle core demonstrates a diffuse infiltrate of large, atypical lymphoid  cells with high N:C ratio, irregular nuclear contours, and prominent nucleoli. Scattered mitotic figures and apoptotic bodes are present. There are admixed histiocytes and smaller mature lymphocytes in the background. Strands of fibrosis and small blood vessels are commonly seen.  Immunohistochemistry: Following examination of H&E morphology and flow cytometry results, immunohistochemical stains were performed on block A1 in order to more accurately evaluate the large atypical cells . Positive and negative controls were evaluated and were appropriate.  IHC/ISH Result  CD3/CD20 CD3 highlights rare T-cells. CD20 is diffusely POSITIVE.  CD10 Diffusely POSITIVE.  BCL-6 POSITIVE (>30%).  MUM-1 Negative (<30%).  BCL-2 DIFFUSELY positive.  c-MYC Negative (<40%).  Cyclin D1 Negative.  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 JULY, 2024- Whole body PET scan revealed hypermetabolic activity in the brain at the level of the left medial thalamus and hypothalamus, a hypermetabolic soft tissue nodule in the left chest wall between the latissimus dorsi and intercostal musculature, hypermetabolic splenic mass, and lytic L2 vertebral body lesion. Splenic mass would not be safely accessed via IR strategies. JULY 2024- CT body IR completed FNA at left chest wall soft tissue nodule; cytology and flow cytometry concerning for DLBCL. LP with CSF studies notable for elevated protein (70), elevated nucleated cells at 7, glucose wnl at 47. Cytology/flow-NEG.   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  Accompanied by  her son.    Cindy Harper 77 y.o.  female  hx of CHF/CAD, HLD, Hypothyroidism-was recently admitted to the hospital at [facility]  for  diplopia gait imbalance and left-sided facial droop.  Patient had extensive workup- MRI brain w/o which demonstrated T2 hyperintense signal at optic chiasm, midbrain, and deep midline structures.  Patient had further workup-including LP; PET scan; and subsequently a biopsy of the posterior chest wall mass.  Biopsy showed-diffuse large B-cell lymphoma.  Patient had extensive evaluation at [facility] medical hematology; neuro-oncology; and also ophthalmology.  Patient was supposed to follow-up with [facility] next week for consideration of chemotherapy/radiation.  However given her grandsons funeral patient discharged home.  Unfortunately given, given insurance issues-patient was subsequently referred to cancer center at [facility] for further evaluation recommendations.   Patient was discharged on dexamethasone daily.  Patient currently in a wheelchair.   Her biggest complaint at this time with ongoing difficulty with hearing; also diplopia.   At baseline she remains very active. Lives independently in her home, cares for her affairs, gardens regularly.  As expected she is quite frustrated at this time.   Review of Systems  Constitutional:  Positive for malaise/fatigue and weight loss. Negative for chills, diaphoresis and fever.  HENT:  Negative for nosebleeds and sore throat.   Eyes:  Negative for double vision.  Respiratory:  Negative for cough, hemoptysis, sputum production, shortness of breath and wheezing.   Cardiovascular:  Negative for chest pain, palpitations, orthopnea and leg swelling.  Gastrointestinal:  Negative for abdominal pain, blood in stool, constipation, diarrhea, heartburn, melena, nausea and vomiting.  Genitourinary:  Negative for dysuria, frequency and urgency.  Musculoskeletal:  Positive for back pain and joint pain.  Skin: Negative.  Negative for itching and rash.  Neurological:  Positive for dizziness and focal weakness. Negative for tingling, weakness and headaches.   Endo/Heme/Allergies:  Does not bruise/bleed easily.  Psychiatric/Behavioral:  Negative for depression. The patient is not nervous/anxious and does not have insomnia.     MEDICAL HISTORY:  Past Medical History:  Diagnosis Date   Arthritis    B12 deficiency    CAD (coronary artery disease)    DCM (dilated cardiomyopathy) (HCC)    Dyspnea    GERD (gastroesophageal reflux disease)    HLD (hyperlipidemia)    Hypothyroidism    Non Q wave myocardial infarction (HCC) 2005    SURGICAL HISTORY: Past Surgical History:  Procedure Laterality Date   ABDOMINAL HYSTERECTOMY     CARDIAC CATHETERIZATION N/A 2005   KNEE ARTHROPLASTY Left 07/09/2016   Procedure: COMPUTER ASSISTED TOTAL KNEE ARTHROPLASTY;  Surgeon: Donato Heinz, MD;  Location: [facility] ORS;  Service: Orthopedics;  Laterality: Left;   KNEE ARTHROPLASTY Right 03/04/2021   Procedure: COMPUTER ASSISTED TOTAL KNEE ARTHROPLASTY;  Surgeon: Donato Heinz, MD;  Location: [facility] ORS;  Service: Orthopedics;  Laterality: Right;    SOCIAL HISTORY: Social History   Socioeconomic History   Marital status: Widowed    Spouse name: Not on file   Number of children: Not on file   Years of education: Not on file   Highest education level: Not on file  Occupational History   Not on file  Tobacco Use   Smoking status: Never   Smokeless tobacco: Never  Vaping Use   Vaping status: Never Used  Substance and Sexual Activity   Alcohol use: No   Drug use: No   Sexual activity: Not on file  Other Topics Concern   Not on file  Social History Narrative   Not on file   Social Determinants of Health   Financial Resource Strain: Patient Declined (04/02/2023)   Received from [facility]   Overall Financial Resource Strain (CARDIA)    Difficulty of Paying Living Expenses: Patient declined  Food Insecurity: No Food Insecurity (04/02/2023)   Received from [facility]   Hunger Vital Sign    Worried About Running  Out of Food in the Last Year: Never true    Ran Out of Food in the Last Year: Never true  Transportation Needs: Unknown (04/02/2023)   Received [facility] - Transportation    In the past 12 months, has lack of transportation kept you from medical appointments or from getting medications?: No    Lack of Transportation (Non-Medical): Not on file  Physical Activity: Not on file  Stress: Not on file  Social Connections: Not on file  Intimate Partner Violence: Not on file    FAMILY HISTORY: Family History  Problem Relation Age of Onset   Breast cancer Mother 17   Kidney disease Father    Breast cancer Maternal Aunt     ALLERGIES:  is allergic to ezetimibe-simvastatin.  MEDICATIONS:  Current Outpatient Medications  Medication Sig Dispense Refill   acetaminophen (TYLENOL) 500 MG tablet Take 500-1,000 mg by mouth every 6 (six) hours as needed for mild pain or headache.     dexamethasone (DECADRON) 4 MG tablet Take 8 mg by mouth daily. taper     potassium chloride (KLOR-CON) 10 MEQ tablet Take 1 tablet (10 mEq total) by mouth daily. 90 tablet 3   sodium chloride 1 g tablet Take 1 g by mouth 2 (two) times daily with a meal.     aspirin 81 MG chewable tablet Chew 81 mg by mouth daily. (Patient not taking: Reported on 04/16/2023)     Cyanocobalamin (VITAMIN B 12 PO) Take by mouth once a week. (Patient not taking: Reported on 04/16/2023)     fluticasone (FLONASE) 50 MCG/ACT nasal spray Place 1 spray into both nostrils daily. (Patient not taking: Reported on 04/16/2023)     hydrochlorothiazide (HYDRODIURIL) 12.5 MG tablet Take 12.5 mg by mouth daily. (Patient not taking: Reported on 04/16/2023)     ibuprofen (ADVIL) 200 MG tablet Take 200 mg by mouth every 6 (six) hours as needed for moderate pain or headache. (Patient not taking: Reported on 04/16/2023)     Lactobacillus (PROBIOTIC ACIDOPHILUS PO) Take by mouth daily. (Patient not taking: Reported on 04/16/2023)      levothyroxine (SYNTHROID) 88 MCG tablet Take 50 mcg by mouth daily before breakfast.     meclizine (ANTIVERT) 25 MG tablet Take 1 tablet (25 mg total) by mouth 3 (three) times daily as needed for dizziness. (Patient not taking: Reported on 04/16/2023) 30 tablet 0   Multiple Vitamin (MULTIVITAMIN WITH MINERALS) TABS tablet Take 1 tablet by mouth daily. (Patient not taking: Reported on 04/16/2023)     sertraline (ZOLOFT) 25 MG tablet Take 1 tablet by mouth daily. (Patient not taking: Reported on 04/16/2023)     No current facility-administered medications for this visit.    PHYSICAL EXAMINATION:  Vitals:   04/16/23 0835  BP: (!) 120/54  Pulse: (!) 54  Temp: 97.6 F (36.4 C)  SpO2: 100%   Filed Weights   04/16/23 0835  Weight: 177 lb (80.3 kg)    Physical Exam Vitals and nursing note reviewed.  HENT:     Head: Normocephalic and atraumatic.     Mouth/Throat:     Pharynx: Oropharynx is clear.  Eyes:     Extraocular Movements: Extraocular movements intact.  Pupils: Pupils are equal, round, and reactive to light.  Cardiovascular:     Rate and Rhythm: Normal rate and regular rhythm.  Pulmonary:     Comments: Decreased breath sounds bilaterally.  Abdominal:     Palpations: Abdomen is soft.  Musculoskeletal:        General: Normal range of motion.     Cervical back: Normal range of motion.  Skin:    General: Skin is warm.  Neurological:     General: No focal deficit present.     Mental Status: She is alert and oriented to person, place, and time.  Psychiatric:        Behavior: Behavior normal.        Judgment: Judgment normal.     LABORATORY DATA:  I have reviewed the data as listed Lab Results  Component Value Date   WBC 14.0 (H) 04/16/2023   HGB 13.0 04/16/2023   HCT 38.9 04/16/2023   MCV 91.1 04/16/2023   PLT 315 04/16/2023   Recent Labs    05/12/22 0711 09/29/22 1952 04/16/23 1012  NA 142 140 137  K 3.9 3.5 4.1  CL 110 108 105  CO2 23 23 26   GLUCOSE 97  126* 100*  BUN 19 13 28*  CREATININE 1.02* 0.82 1.09*  CALCIUM 9.2 9.2 8.8*  GFRNONAA 56* >60 52*  PROT  --   --  6.3*  ALBUMIN  --   --  3.5  AST  --   --  23  ALT  --   --  45*  ALKPHOS  --   --  53  BILITOT  --   --  0.6    RADIOGRAPHIC STUDIES: I have personally reviewed the radiological images as listed and agreed with the findings in the report. MR BRAIN WO CONTRAST  Result Date: 04/01/2023 CLINICAL DATA:  Double vision.  Difficulty focusing. EXAM: MRI HEAD WITHOUT CONTRAST TECHNIQUE: Multiplanar, multiecho pulse sequences of the brain and surrounding structures were obtained without intravenous contrast. COMPARISON:  None Available. FINDINGS: Brain: Symmetric FLAIR hyperintensity involve the left greater than right thalami, hypothalamus, and optic nerves. Possible subtle periaqueductal FLAIR hyperintensity in the dorsal pons. Punctate acute cortical infarct in the left frontal lobe and high right parietal lobe. Punctate acute cortical infarct in the left frontal lobe and high right parietal lobe. No acute hemorrhage, midline shift or hydrocephalus. New area of T2/FLAIR hyperintensity along the high left parietal cortex (series 15, image 36) without definite correlate on other sequences. Vascular: Major arterial flow voids are maintained. Skull and upper cervical spine: Normal marrow signal. Sinuses/Orbits: Clear sinuses.  No acute orbital findings. Other: No mastoid effusions. IMPRESSION: 1. Symmetric FLAIR hyperintensity involve the left greater than right thalami, hypothalamus, and optic nerves with possible subtle periaqueductal FLAIR hyperintensity in the dorsal pons. Primary differential considerations include demyelination (particularly neuromyelitis optica) and alcoholic encephalopathy (aka Wernicke encephalopathy). Less likely considerations include glioma or acute infarct (particularly in the left thalamus). Recommend postcontrast imaging to further evaluate and also consideration of  spine imaging to assess for spine lesions. 2. Punctate acute cortical infarct in the left frontal lobe and high right parietal lobe. 3. New area of T2/FLAIR hyperintensity along the high left parietal cortex without definite correlate on other sequences. This finding is indeterminate but could potentially represent area of encephalomalacia. Recommend attention on the recommended follow-up postcontrast imaging. These results will be called to the ordering clinician or representative by the Radiologist Assistant, and communication documented in the PACS  or [employer]. Electronically Signed   By: Feliberto Harts M.D.   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  No eye involvement.  JULY, 2024- Whole body PET scan revealed hypermetabolic activity in the brain at the level of the left medial thalamus and hypothalamus, a hypermetabolic soft tissue nodule in the left chest wall between the latissimus dorsi and intercostal musculature, hypermetabolic splenic mass, and lytic L2 vertebral body lesion. Splenic mass would not be safely accessed via IR strategies. JULY 2024- CT body IR completed FNA at left chest wall soft tissue nodule; cytology and flow cytometry concerning for DLBCL. LP with CSF studies notable for elevated protein (70), elevated nucleated cells at 7, glucose wnl at 47.  # I had a long discussion with the patient and her son regarding the serious diagnosis of above lymphoma. IESLG Risk categorization moderate -high risk:.  Prognostically-survival [with High dose MXT]- at 2 years: 20 to 30%.   # Discussed that high-dose methotrexate therapy would involve hospitalization-and stay up to a week-based upon methotrexate clearance.  Patient will need to have adequate renal function/and also cardiac function.  Given the risk of relapse after induction therapy-consolidation  therapy is recommended.  Consolidation therapy includes-autologous stem cell transplant in eligible patients Vs. whole brain radiation.  Given long-term toxicity from whole brain radiation, this has fallen out of favor.   # In the context of above aggressive lymphoma patient's medical history unfortunately is complicated by her advanced age, CHF [although recent 2D echo ejection fraction 50 to 55%]; CKD stage III [GFR 54]; in general given patient's preference for any aggressive therapies/hospital stay.   # Patient family understand the treatment more likely to be palliative rather than curative.  After the lengthy discussion-patient/family leaning towards palliative radiation/and palliative systemic chemotherapy-patient clinically responds well to radiation.  Discussed with Dr. Aggie Cosier.   # Hx of CHF/CAD-  Hx of EF 40-45% (12/2022); TTE-J ULY 2024- LVEF 50-55% [[facility]] Off HCTZ was discontinued in setting of hyponatremia. Monitor re: while on systemic therapy.   # Hypothyroidism: Continue Synthroid.  # Given the complicated nature of patient's condition and also patient's evaluation at [facility]-I have reached out to [facility], and also Dr.Vasolw, neuro oncologist at [facility].   Thank you Dr. Sherryll Burger for allowing me to participate in the care of your pleasant patient. Please do not hesitate to contact me with questions or concerns in the interim.  Discussed with Dr. Aggie Cosier.  # DISPOSITION: # lab-s cbc/cmp;LDH; BNP -please order # referral to Dr.Chrystal ASAP re: CNS lymphoma # Follow up TBD- Dr.B  # 60 minutes face-to-face with the patient discussing the above plan of care; more than 50% of time spent on prognosis/ natural history; counseling and coordination.  I reviewed at length the above plan of care with the patient's in detail.  He is in agreement.  Above plan of care was discussed with patient/family in detail.  My contact information was given to the patient/family.     Earna Coder, MD 04/16/2023 10:24 PM

## 2023-04-15 NOTE — Assessment & Plan Note (Signed)
Workup included large volume LP with CSF studies notable for elevated protein (70), elevated nucleated cells at 7, glucose wnl at 47. Remainder of infectious and inflammatory workup was unremarkable as seen in results section. Cytology and flow cytometry negative for malignancy. Ophthalmology was consulted to perform slit lamp eye exam for potential vitreous biopsy, which was negative for malignancy. CT CAP revealed 1cm RLL pulmonary nodule, 2.4cm splenic mass, and L2 vertebral body compression deformity. Neuro-oncology was consulted with recommendation for tissue biopsy. Evaluation of sub-occipital mass revealed vascular malformation rather than sub-occipital lymph node, so not amenable for biopsy. Whole body PET scan revealed hypermetabolic activity in the brain at the level of the left medial thalamus and hypothalamus, a hypermetabolic soft tissue nodule in the left chest wall between the latissimus dorsi and intercostal musculature, hypermetabolic splenic mass, and lytic L2 vertebral body lesion. Splenic mass would not be safely accessed via IR strategies. CT body IR completed FNA at left chest wall soft tissue nodule; cytology and flow cytometry concerning for DLBCL. Malignant hematology was consulted, with discussion for HD-MTX and version of R-CHOP vs palliative radiation therapy. GOC on 7/27 yielded a plan for discharge on 7/28. She was discharged on dexamethasone 8mg  daily with plan to follow-up with Neuro-Oncology in clinic the week of 8/5 and for HD-MTX induction with elective Neuro-Oncology admission to be discussed at that appointment. Funeral for grandson is scheduled for Friday, August 2nd. This was discussed between Dr. Cedric Fishman, Malignant Hematology, and CNS Lymphoma Coordinator, Lanae Crumbly, NP.   Notably she was Hep B negative and Quant Gold negative, received Hep B vaccine (7/22) in advance of possible chemotherapeutic induction. Will need another at 1 month and 6 months (also 2 months  if immunocompromised).

## 2023-04-16 ENCOUNTER — Inpatient Hospital Stay: Payer: No Typology Code available for payment source | Attending: Internal Medicine | Admitting: Internal Medicine

## 2023-04-16 ENCOUNTER — Inpatient Hospital Stay: Payer: No Typology Code available for payment source

## 2023-04-16 ENCOUNTER — Inpatient Hospital Stay
Admission: RE | Admit: 2023-04-16 | Discharge: 2023-04-16 | Disposition: A | Discharge status: Home / Self Care | Payer: Self-pay | Source: Ambulatory Visit | Attending: Radiation Oncology | Admitting: Radiation Oncology

## 2023-04-16 ENCOUNTER — Ambulatory Visit
Admission: RE | Admit: 2023-04-16 | Discharge: 2023-04-16 | Disposition: A | Discharge status: Home / Self Care | Payer: No Typology Code available for payment source | Source: Ambulatory Visit | Attending: Radiation Oncology | Admitting: Radiation Oncology

## 2023-04-16 ENCOUNTER — Other Ambulatory Visit: Payer: Self-pay

## 2023-04-16 ENCOUNTER — Encounter: Payer: Self-pay | Admitting: Internal Medicine

## 2023-04-16 VITALS — BP 120/54 | HR 54 | Temp 97.6°F | Ht 66.0 in | Wt 177.0 lb

## 2023-04-16 DIAGNOSIS — H532 Diplopia: Secondary | ICD-10-CM | POA: Insufficient documentation

## 2023-04-16 DIAGNOSIS — Z510 Encounter for antineoplastic radiation therapy: Secondary | ICD-10-CM | POA: Insufficient documentation

## 2023-04-16 DIAGNOSIS — N183 Chronic kidney disease, stage 3 unspecified: Secondary | ICD-10-CM | POA: Diagnosis not present

## 2023-04-16 DIAGNOSIS — C8339 Diffuse large B-cell lymphoma, extranodal and solid organ sites: Secondary | ICD-10-CM | POA: Insufficient documentation

## 2023-04-16 DIAGNOSIS — C8599 Non-Hodgkin lymphoma, unspecified, extranodal and solid organ sites: Secondary | ICD-10-CM

## 2023-04-16 DIAGNOSIS — Z79899 Other long term (current) drug therapy: Secondary | ICD-10-CM | POA: Insufficient documentation

## 2023-04-16 DIAGNOSIS — E039 Hypothyroidism, unspecified: Secondary | ICD-10-CM | POA: Diagnosis not present

## 2023-04-16 DIAGNOSIS — I420 Dilated cardiomyopathy: Secondary | ICD-10-CM | POA: Diagnosis not present

## 2023-04-16 DIAGNOSIS — C8389 Other non-follicular lymphoma, extranodal and solid organ sites: Secondary | ICD-10-CM | POA: Diagnosis not present

## 2023-04-16 LAB — CBC WITH DIFFERENTIAL (CANCER CENTER ONLY)
Abs Immature Granulocytes: 0.17 10*3/uL — ABNORMAL HIGH (ref 0.00–0.07)
Basophils Absolute: 0 10*3/uL (ref 0.0–0.1)
Basophils Relative: 0 %
Eosinophils Absolute: 0 10*3/uL (ref 0.0–0.5)
Eosinophils Relative: 0 %
HCT: 38.9 % (ref 36.0–46.0)
Hemoglobin: 13 g/dL (ref 12.0–15.0)
Immature Granulocytes: 1 %
Lymphocytes Relative: 6 %
Lymphs Abs: 0.9 10*3/uL (ref 0.7–4.0)
MCH: 30.4 pg (ref 26.0–34.0)
MCHC: 33.4 g/dL (ref 30.0–36.0)
MCV: 91.1 fL (ref 80.0–100.0)
Monocytes Absolute: 1 10*3/uL (ref 0.1–1.0)
Monocytes Relative: 7 %
Neutro Abs: 12 10*3/uL — ABNORMAL HIGH (ref 1.7–7.7)
Neutrophils Relative %: 86 %
Platelet Count: 315 10*3/uL (ref 150–400)
RBC: 4.27 MIL/uL (ref 3.87–5.11)
RDW: 14.6 % (ref 11.5–15.5)
WBC Count: 14 10*3/uL — ABNORMAL HIGH (ref 4.0–10.5)
nRBC: 0 % (ref 0.0–0.2)

## 2023-04-16 LAB — BRAIN NATRIURETIC PEPTIDE: B Natriuretic Peptide: 259.6 pg/mL — ABNORMAL HIGH (ref 0.0–100.0)

## 2023-04-16 LAB — CMP (CANCER CENTER ONLY)
ALT: 45 U/L — ABNORMAL HIGH (ref 0–44)
AST: 23 U/L (ref 15–41)
Albumin: 3.5 g/dL (ref 3.5–5.0)
Alkaline Phosphatase: 53 U/L (ref 38–126)
Anion gap: 6 (ref 5–15)
BUN: 28 mg/dL — ABNORMAL HIGH (ref 8–23)
CO2: 26 mmol/L (ref 22–32)
Calcium: 8.8 mg/dL — ABNORMAL LOW (ref 8.9–10.3)
Chloride: 105 mmol/L (ref 98–111)
Creatinine: 1.09 mg/dL — ABNORMAL HIGH (ref 0.44–1.00)
GFR, Estimated: 52 mL/min — ABNORMAL LOW (ref >60)
Glucose, Bld: 100 mg/dL — ABNORMAL HIGH (ref 70–99)
Potassium: 4.1 mmol/L (ref 3.5–5.1)
Sodium: 137 mmol/L (ref 135–145)
Total Bilirubin: 0.6 mg/dL (ref 0.3–1.2)
Total Protein: 6.3 g/dL — ABNORMAL LOW (ref 6.5–8.1)

## 2023-04-16 LAB — LACTATE DEHYDROGENASE: LDH: 146 U/L (ref 98–192)

## 2023-04-16 NOTE — Consult Note (Signed)
NEW PATIENT EVALUATION  Name: Cindy Harper  MRN: [id number]  Date:   04/16/2023     DOB: 06/05/1946   This 77 y.o. female patient presents to the clinic for initial evaluation of brain involvement of DLBCL.  REFERRING PHYSICIAN: Danella Penton, MD  CHIEF COMPLAINT: No chief complaint on file.   DIAGNOSIS: Diffuse large B-cell lymphoma   PREVIOUS INVESTIGATIONS:  MRI scan PET CT scans reviewed Clinical notes reviewed Pathology reports reviewed  HPI: Patient is a 77 year old female who presented with sudden onset of neurologic changes including loss of balance and hearing with binocular diplopia.  MRI scan of the brain demonstrated T2 hypointense signals in the optic chiasm midbrain and deep midline structures.  She was seen at [facility].  PET CT scan at that time showed hypermetabolic activity above and below the diaphragm concerning for lymphoma or sarcoidosis.  Focal radiotracer uptake in the brain showed uptake within the medial thalamus and hypothalamus.  There is also a right optic nerve and left medial rectus muscle involvement.  She also hypermetabolic soft tissue nodules in the left chest wall as well as a splenic mass and a lytic L2 vertebral body lesion.  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  I have been asked to evaluate the patient for whole brain radiation based on her clinical and radiologic findings.  According to medical oncology she is not a candidate for intense chemotherapy.  Seen today she is wearing a left eye patch.  Again complaining of diplopia.  She is specifically [place] fever chills or night sweats.  She is having no pain in her lower back.  PLANNED TREATMENT REGIMEN: Whole brain radiation  PAST MEDICAL HISTORY:  has a past medical history of Arthritis, B12 deficiency, CAD  (coronary artery disease), DCM (dilated cardiomyopathy) (HCC), Dyspnea, GERD (gastroesophageal reflux disease), HLD (hyperlipidemia), Hypothyroidism, and Non Q wave myocardial infarction (HCC) (2005).    PAST SURGICAL HISTORY:  Past Surgical History:  Procedure Laterality Date   ABDOMINAL HYSTERECTOMY     CARDIAC CATHETERIZATION N/A 2005   KNEE ARTHROPLASTY Left 07/09/2016   Procedure: COMPUTER ASSISTED TOTAL KNEE ARTHROPLASTY;  Surgeon: Donato Heinz, MD;  Location: [facility] ORS;  Service: Orthopedics;  Laterality: Left;   KNEE ARTHROPLASTY Right 03/04/2021   Procedure: COMPUTER ASSISTED TOTAL KNEE ARTHROPLASTY;  Surgeon: Donato Heinz, MD;  Location: [facility] ORS;  Service: Orthopedics;  Laterality: Right;    FAMILY HISTORY: family history includes Breast cancer in her maternal aunt; Breast cancer (age of onset: 15) in her mother; Kidney disease in her father.  SOCIAL HISTORY:  reports that she has never smoked. She has never used smokeless tobacco. She reports that she does not drink alcohol and does not use drugs.  ALLERGIES: Ezetimibe-simvastatin  MEDICATIONS:  Current Outpatient Medications  Medication Sig Dispense Refill   acetaminophen (TYLENOL) 500 MG tablet Take 500-1,000 mg by mouth every 6 (six) hours as needed for mild pain or headache.     aspirin 81 MG chewable tablet Chew 81 mg by mouth daily. (Patient not taking: Reported on 04/16/2023)     Cyanocobalamin (VITAMIN B 12 PO) Take by mouth once a week. (Patient not taking: Reported on 04/16/2023)     dexamethasone (DECADRON) 4 MG tablet Take 8 mg by mouth daily. taper     fluticasone (FLONASE) 50 MCG/ACT nasal  spray Place 1 spray into both nostrils daily. (Patient not taking: Reported on 04/16/2023)     hydrochlorothiazide (HYDRODIURIL) 12.5 MG tablet Take 12.5 mg by mouth daily. (Patient not taking: Reported on 04/16/2023)     ibuprofen (ADVIL) 200 MG tablet Take 200 mg by mouth every 6 (six) hours as needed for moderate pain or  headache. (Patient not taking: Reported on 04/16/2023)     Lactobacillus (PROBIOTIC ACIDOPHILUS PO) Take by mouth daily. (Patient not taking: Reported on 04/16/2023)     levothyroxine (SYNTHROID) 88 MCG tablet Take 50 mcg by mouth daily before breakfast.     meclizine (ANTIVERT) 25 MG tablet Take 1 tablet (25 mg total) by mouth 3 (three) times daily as needed for dizziness. (Patient not taking: Reported on 04/16/2023) 30 tablet 0   Multiple Vitamin (MULTIVITAMIN WITH MINERALS) TABS tablet Take 1 tablet by mouth daily. (Patient not taking: Reported on 04/16/2023)     potassium chloride (KLOR-CON) 10 MEQ tablet Take 1 tablet (10 mEq total) by mouth daily. 90 tablet 3   sertraline (ZOLOFT) 25 MG tablet Take 1 tablet by mouth daily. (Patient not taking: Reported on 04/16/2023)     sodium chloride 1 g tablet Take 1 g by mouth 2 (two) times daily with a meal.     No current facility-administered medications for this encounter.    ECOG PERFORMANCE STATUS:  2 - Symptomatic, <50% confined to bed  REVIEW OF SYSTEMS: Patient denies any weight loss, fatigue, weakness, fever, chills or night sweats. Patient denies any loss of vision, blurred vision. Patient denies any ringing  of the ears or hearing loss. No irregular heartbeat. Patient denies heart murmur or history of fainting. Patient denies any chest pain or pain radiating to her upper extremities. Patient denies any shortness of breath, difficulty breathing at night, cough or hemoptysis. Patient denies any swelling in the lower legs. Patient denies any nausea vomiting, vomiting of blood, or coffee ground material in the vomitus. Patient denies any stomach pain. Patient states has had normal bowel movements no significant constipation or diarrhea. Patient denies any dysuria, hematuria or significant nocturia. Patient denies any problems walking, swelling in the joints or loss of balance. Patient denies any skin changes, loss of hair or loss of weight. Patient denies any  excessive worrying or anxiety or significant depression. Patient denies any problems with insomnia. Patient denies excessive thirst, polyuria, polydipsia. Patient denies any swollen glands, patient denies easy bruising or easy bleeding. Patient denies any recent infections, allergies or URI. Patient "s visual fields have not changed significantly in recent time.   PHYSICAL EXAM: There were no vitals taken for this visit. Wheelchair-bound female in NAD.  Motor sensor in detail levels are equal and symmetric in the upper lower extremities proprioception is intact.  Crude visual fields within normal range.  She is having diplopia.  Well-developed well-nourished patient in NAD. HEENT reveals PERLA, EOMI, discs not visualized.  Oral cavity is clear. No oral mucosal lesions are identified. Neck is clear without evidence of cervical or supraclavicular adenopathy. Lungs are clear to A&P. Cardiac examination is essentially unremarkable with regular rate and rhythm without murmur rub or thrill. Abdomen is benign with no organomegaly or masses noted. Motor sensory and DTR levels are equal and symmetric in the upper and lower extremities. Cranial nerves II through XII are grossly intact. Proprioception is intact. No peripheral adenopathy or edema is identified. No motor or sensory levels are noted. Crude visual fields are within normal range.  LABORATORY  DATA: Pathology reports reviewed    RADIOLOGY RESULTS: MRI of brain and PET CT scans reviewed compatible with above-stated findings   IMPRESSION: Brain involvement of diffuse large B-cell lymphoma and 77 year old female  PLAN: At this time according to recent data 38 Gray in 10 fractions is suitable for patient not a chemotherapy candidate to prevent further progression of neurologic decline from involvement of diffuse large B-cell lymphoma.  Risks and benefits of treatment including hair loss fatigue alteration of blood counts skin reaction all were reviewed with  the patient and her son.  Based on her condition I am simulating her today.  Patient comprehends my recommendations well.  Down the road I may entertain short course of palliative treatment to her L-spine with the lytic lesion involved.  I would like to take this opportunity to thank you for allowing me to participate in the care of your patient.Carmina Miller, MD

## 2023-04-17 ENCOUNTER — Other Ambulatory Visit: Payer: Self-pay | Admitting: *Deleted

## 2023-04-17 DIAGNOSIS — E222 Syndrome of inappropriate secretion of antidiuretic hormone: Secondary | ICD-10-CM | POA: Diagnosis not present

## 2023-04-17 DIAGNOSIS — C8330 Diffuse large B-cell lymphoma, unspecified site: Secondary | ICD-10-CM | POA: Diagnosis not present

## 2023-04-17 DIAGNOSIS — C7931 Secondary malignant neoplasm of brain: Secondary | ICD-10-CM | POA: Diagnosis not present

## 2023-04-17 MED ORDER — DEXAMETHASONE 4 MG PO TABS: 4.0000 mg | ORAL_TABLET | Freq: Two times a day (BID) | ORAL | 0 refills | Status: DC

## 2023-04-20 ENCOUNTER — Other Ambulatory Visit: Payer: Self-pay | Admitting: *Deleted

## 2023-04-20 ENCOUNTER — Other Ambulatory Visit: Payer: Self-pay

## 2023-04-20 ENCOUNTER — Ambulatory Visit
Admission: RE | Admit: 2023-04-20 | Discharge: 2023-04-20 | Disposition: A | Discharge status: Home / Self Care | Payer: No Typology Code available for payment source | Source: Ambulatory Visit | Attending: Radiation Oncology | Admitting: Radiation Oncology

## 2023-04-20 DIAGNOSIS — C8599 Non-Hodgkin lymphoma, unspecified, extranodal and solid organ sites: Secondary | ICD-10-CM

## 2023-04-20 DIAGNOSIS — Z510 Encounter for antineoplastic radiation therapy: Secondary | ICD-10-CM | POA: Diagnosis not present

## 2023-04-20 LAB — RAD ONC ARIA SESSION SUMMARY
Course Elapsed Days: 0
Plan Fractions Treated to Date: 1
Plan Prescribed Dose Per Fraction: 3 Gy
Plan Total Fractions Prescribed: 10
Plan Total Prescribed Dose: 30 Gy
Reference Point Dosage Given to Date: 3 Gy
Reference Point Session Dosage Given: 3 Gy
Session Number: 1

## 2023-04-21 ENCOUNTER — Ambulatory Visit
Admission: RE | Admit: 2023-04-21 | Discharge: 2023-04-21 | Disposition: A | Discharge status: Home / Self Care | Payer: No Typology Code available for payment source | Source: Ambulatory Visit | Attending: Radiation Oncology | Admitting: Radiation Oncology

## 2023-04-21 ENCOUNTER — Other Ambulatory Visit: Payer: Self-pay

## 2023-04-21 DIAGNOSIS — Z510 Encounter for antineoplastic radiation therapy: Secondary | ICD-10-CM | POA: Diagnosis not present

## 2023-04-21 LAB — RAD ONC ARIA SESSION SUMMARY
Course Elapsed Days: 1
Plan Fractions Treated to Date: 2
Plan Prescribed Dose Per Fraction: 3 Gy
Plan Total Fractions Prescribed: 10
Plan Total Prescribed Dose: 30 Gy
Reference Point Dosage Given to Date: 6 Gy
Reference Point Session Dosage Given: 3 Gy
Session Number: 2

## 2023-04-22 ENCOUNTER — Ambulatory Visit
Admission: RE | Admit: 2023-04-22 | Discharge: 2023-04-22 | Disposition: A | Discharge status: Home / Self Care | Payer: No Typology Code available for payment source | Source: Ambulatory Visit | Attending: Radiation Oncology | Admitting: Radiation Oncology

## 2023-04-22 ENCOUNTER — Telehealth: Payer: Self-pay | Admitting: *Deleted

## 2023-04-22 ENCOUNTER — Other Ambulatory Visit: Payer: Self-pay

## 2023-04-22 DIAGNOSIS — Z510 Encounter for antineoplastic radiation therapy: Secondary | ICD-10-CM | POA: Diagnosis not present

## 2023-04-22 LAB — RAD ONC ARIA SESSION SUMMARY
Course Elapsed Days: 2
Plan Fractions Treated to Date: 3
Plan Prescribed Dose Per Fraction: 3 Gy
Plan Total Fractions Prescribed: 10
Plan Total Prescribed Dose: 30 Gy
Reference Point Dosage Given to Date: 9 Gy
Reference Point Session Dosage Given: 3 Gy
Session Number: 3

## 2023-04-22 NOTE — Telephone Encounter (Signed)
Patient's daughter called to report that patient had had a syncopal episode this morning she did not fall but was non responsive for about a minute. EMS was called patient was stable when they arrived with a low but normal BP. Daughter wants to know if she should still come for her treatment.

## 2023-04-23 ENCOUNTER — Ambulatory Visit: Payer: No Typology Code available for payment source

## 2023-04-23 ENCOUNTER — Inpatient Hospital Stay: Payer: No Typology Code available for payment source

## 2023-04-24 ENCOUNTER — Other Ambulatory Visit: Payer: Self-pay

## 2023-04-24 ENCOUNTER — Ambulatory Visit
Admission: RE | Admit: 2023-04-24 | Discharge: 2023-04-24 | Disposition: A | Discharge status: Home / Self Care | Payer: No Typology Code available for payment source | Source: Ambulatory Visit | Attending: Radiation Oncology | Admitting: Radiation Oncology

## 2023-04-24 DIAGNOSIS — Z510 Encounter for antineoplastic radiation therapy: Secondary | ICD-10-CM | POA: Diagnosis not present

## 2023-04-24 LAB — RAD ONC ARIA SESSION SUMMARY
Course Elapsed Days: 4
Plan Fractions Treated to Date: 4
Plan Prescribed Dose Per Fraction: 3 Gy
Plan Total Fractions Prescribed: 10
Plan Total Prescribed Dose: 30 Gy
Reference Point Dosage Given to Date: 12 Gy
Reference Point Session Dosage Given: 3 Gy
Session Number: 4

## 2023-04-27 ENCOUNTER — Ambulatory Visit
Admission: RE | Admit: 2023-04-27 | Discharge: 2023-04-27 | Disposition: A | Discharge status: Home / Self Care | Payer: No Typology Code available for payment source | Source: Ambulatory Visit | Attending: Radiation Oncology | Admitting: Radiation Oncology

## 2023-04-27 ENCOUNTER — Other Ambulatory Visit: Payer: Self-pay

## 2023-04-27 ENCOUNTER — Inpatient Hospital Stay: Payer: No Typology Code available for payment source

## 2023-04-27 DIAGNOSIS — C8339 Diffuse large B-cell lymphoma, extranodal and solid organ sites: Secondary | ICD-10-CM | POA: Diagnosis not present

## 2023-04-27 DIAGNOSIS — C8599 Non-Hodgkin lymphoma, unspecified, extranodal and solid organ sites: Secondary | ICD-10-CM

## 2023-04-27 DIAGNOSIS — Z510 Encounter for antineoplastic radiation therapy: Secondary | ICD-10-CM | POA: Diagnosis not present

## 2023-04-27 LAB — RAD ONC ARIA SESSION SUMMARY
Course Elapsed Days: 7
Plan Fractions Treated to Date: 5
Plan Prescribed Dose Per Fraction: 3 Gy
Plan Total Fractions Prescribed: 10
Plan Total Prescribed Dose: 30 Gy
Reference Point Dosage Given to Date: 15 Gy
Reference Point Session Dosage Given: 3 Gy
Session Number: 5

## 2023-04-27 LAB — CBC (CANCER CENTER ONLY)
HCT: 43.4 % (ref 36.0–46.0)
Hemoglobin: 14.1 g/dL (ref 12.0–15.0)
MCH: 30.1 pg (ref 26.0–34.0)
MCHC: 32.5 g/dL (ref 30.0–36.0)
MCV: 92.7 fL (ref 80.0–100.0)
Platelet Count: 180 10*3/uL (ref 150–400)
RBC: 4.68 MIL/uL (ref 3.87–5.11)
RDW: 15.6 % — ABNORMAL HIGH (ref 11.5–15.5)
WBC Count: 10.4 10*3/uL (ref 4.0–10.5)
nRBC: 0 % (ref 0.0–0.2)

## 2023-04-28 ENCOUNTER — Ambulatory Visit
Admission: RE | Admit: 2023-04-28 | Discharge: 2023-04-28 | Disposition: A | Discharge status: Home / Self Care | Payer: No Typology Code available for payment source | Source: Ambulatory Visit | Attending: Radiation Oncology | Admitting: Radiation Oncology

## 2023-04-28 ENCOUNTER — Other Ambulatory Visit: Payer: Self-pay

## 2023-04-28 DIAGNOSIS — Z510 Encounter for antineoplastic radiation therapy: Secondary | ICD-10-CM | POA: Diagnosis not present

## 2023-04-28 LAB — RAD ONC ARIA SESSION SUMMARY
Course Elapsed Days: 8
Plan Fractions Treated to Date: 6
Plan Prescribed Dose Per Fraction: 3 Gy
Plan Total Fractions Prescribed: 10
Plan Total Prescribed Dose: 30 Gy
Reference Point Dosage Given to Date: 18 Gy
Reference Point Session Dosage Given: 3 Gy
Session Number: 6

## 2023-04-29 ENCOUNTER — Ambulatory Visit: Payer: No Typology Code available for payment source

## 2023-04-29 ENCOUNTER — Ambulatory Visit: Admission: RE | Admit: 2023-04-29 | Payer: No Typology Code available for payment source | Source: Ambulatory Visit

## 2023-04-29 ENCOUNTER — Other Ambulatory Visit: Payer: Self-pay | Admitting: *Deleted

## 2023-04-29 MED ORDER — LANSOPRAZOLE 30 MG PO CPDR: 30.0000 mg | DELAYED_RELEASE_CAPSULE | Freq: Every day | ORAL | 1 refills | Status: DC

## 2023-04-29 MED ORDER — ONDANSETRON HCL 8 MG PO TABS: 8.0000 mg | ORAL_TABLET | Freq: Three times a day (TID) | ORAL | 1 refills | Status: DC | PRN

## 2023-04-30 ENCOUNTER — Ambulatory Visit: Payer: No Typology Code available for payment source

## 2023-04-30 ENCOUNTER — Inpatient Hospital Stay: Payer: No Typology Code available for payment source

## 2023-04-30 ENCOUNTER — Other Ambulatory Visit: Payer: Self-pay

## 2023-04-30 ENCOUNTER — Ambulatory Visit
Admission: RE | Admit: 2023-04-30 | Discharge: 2023-04-30 | Disposition: A | Discharge status: Home / Self Care | Payer: No Typology Code available for payment source | Source: Ambulatory Visit | Attending: Radiation Oncology | Admitting: Radiation Oncology

## 2023-04-30 DIAGNOSIS — C8332 Diffuse large B-cell lymphoma, intrathoracic lymph nodes: Secondary | ICD-10-CM | POA: Diagnosis not present

## 2023-04-30 DIAGNOSIS — N3090 Cystitis, unspecified without hematuria: Secondary | ICD-10-CM | POA: Diagnosis not present

## 2023-04-30 DIAGNOSIS — E871 Hypo-osmolality and hyponatremia: Secondary | ICD-10-CM | POA: Diagnosis not present

## 2023-04-30 DIAGNOSIS — Z510 Encounter for antineoplastic radiation therapy: Secondary | ICD-10-CM | POA: Diagnosis not present

## 2023-04-30 LAB — RAD ONC ARIA SESSION SUMMARY
Course Elapsed Days: 10
Plan Fractions Treated to Date: 7
Plan Prescribed Dose Per Fraction: 3 Gy
Plan Total Fractions Prescribed: 10
Plan Total Prescribed Dose: 30 Gy
Reference Point Dosage Given to Date: 21 Gy
Reference Point Session Dosage Given: 3 Gy
Session Number: 7

## 2023-05-01 ENCOUNTER — Other Ambulatory Visit: Payer: Self-pay

## 2023-05-01 ENCOUNTER — Ambulatory Visit
Admission: RE | Admit: 2023-05-01 | Discharge: 2023-05-01 | Disposition: A | Discharge status: Home / Self Care | Payer: No Typology Code available for payment source | Source: Ambulatory Visit | Attending: Radiation Oncology | Admitting: Radiation Oncology

## 2023-05-01 DIAGNOSIS — Z510 Encounter for antineoplastic radiation therapy: Secondary | ICD-10-CM | POA: Diagnosis not present

## 2023-05-01 LAB — RAD ONC ARIA SESSION SUMMARY
Course Elapsed Days: 11
Plan Fractions Treated to Date: 8
Plan Prescribed Dose Per Fraction: 3 Gy
Plan Total Fractions Prescribed: 10
Plan Total Prescribed Dose: 30 Gy
Reference Point Dosage Given to Date: 24 Gy
Reference Point Session Dosage Given: 3 Gy
Session Number: 8

## 2023-05-04 ENCOUNTER — Ambulatory Visit
Admission: RE | Admit: 2023-05-04 | Discharge: 2023-05-04 | Disposition: A | Discharge status: Home / Self Care | Payer: No Typology Code available for payment source | Source: Ambulatory Visit | Attending: Radiation Oncology | Admitting: Radiation Oncology

## 2023-05-04 ENCOUNTER — Ambulatory Visit: Payer: No Typology Code available for payment source

## 2023-05-04 ENCOUNTER — Other Ambulatory Visit: Payer: Self-pay

## 2023-05-04 DIAGNOSIS — Z510 Encounter for antineoplastic radiation therapy: Secondary | ICD-10-CM | POA: Diagnosis not present

## 2023-05-04 LAB — RAD ONC ARIA SESSION SUMMARY
Course Elapsed Days: 14
Plan Fractions Treated to Date: 9
Plan Prescribed Dose Per Fraction: 3 Gy
Plan Total Fractions Prescribed: 10
Plan Total Prescribed Dose: 30 Gy
Reference Point Dosage Given to Date: 27 Gy
Reference Point Session Dosage Given: 3 Gy
Session Number: 9

## 2023-05-05 ENCOUNTER — Ambulatory Visit
Admission: RE | Admit: 2023-05-05 | Discharge: 2023-05-05 | Disposition: A | Discharge status: Home / Self Care | Payer: No Typology Code available for payment source | Source: Ambulatory Visit | Attending: Radiation Oncology | Admitting: Radiation Oncology

## 2023-05-05 ENCOUNTER — Ambulatory Visit: Payer: No Typology Code available for payment source

## 2023-05-05 ENCOUNTER — Other Ambulatory Visit: Payer: Self-pay

## 2023-05-05 DIAGNOSIS — Z510 Encounter for antineoplastic radiation therapy: Secondary | ICD-10-CM | POA: Diagnosis not present

## 2023-05-05 LAB — RAD ONC ARIA SESSION SUMMARY
Course Elapsed Days: 15
Plan Fractions Treated to Date: 10
Plan Prescribed Dose Per Fraction: 3 Gy
Plan Total Fractions Prescribed: 10
Plan Total Prescribed Dose: 30 Gy
Reference Point Dosage Given to Date: 30 Gy
Reference Point Session Dosage Given: 3 Gy
Session Number: 10

## 2023-05-06 ENCOUNTER — Ambulatory Visit: Payer: No Typology Code available for payment source

## 2023-05-21 ENCOUNTER — Other Ambulatory Visit: Payer: Self-pay | Admitting: Radiation Oncology

## 2023-06-02 ENCOUNTER — Other Ambulatory Visit: Payer: Self-pay

## 2023-06-02 ENCOUNTER — Encounter: Payer: Self-pay | Admitting: Internal Medicine

## 2023-06-02 ENCOUNTER — Telehealth: Payer: Self-pay | Admitting: Internal Medicine

## 2023-06-02 DIAGNOSIS — C8599 Non-Hodgkin lymphoma, unspecified, extranodal and solid organ sites: Secondary | ICD-10-CM

## 2023-06-02 NOTE — Telephone Encounter (Signed)
Lab orders entered

## 2023-06-02 NOTE — Telephone Encounter (Signed)
Please have the patient follow-up in the next couple of weeks-MD; labs CBC CMP [facility].   Thanks GB

## 2023-06-03 ENCOUNTER — Telehealth: Payer: Self-pay | Admitting: *Deleted

## 2023-06-03 ENCOUNTER — Encounter: Payer: Self-pay | Admitting: Internal Medicine

## 2023-06-03 ENCOUNTER — Inpatient Hospital Stay
Payer: No Typology Code available for payment source | Attending: Internal Medicine | Admitting: Hospice and Palliative Medicine

## 2023-06-03 ENCOUNTER — Telehealth: Payer: Self-pay

## 2023-06-03 DIAGNOSIS — C8599 Non-Hodgkin lymphoma, unspecified, extranodal and solid organ sites: Secondary | ICD-10-CM

## 2023-06-03 MED ORDER — MORPHINE SULFATE (CONCENTRATE) 10 MG /0.5 ML PO SOLN: 5.0000 mg | ORAL | 0 refills | Status: DC | PRN

## 2023-06-03 MED ORDER — LORAZEPAM 0.5 MG PO TABS: 0.5000 mg | ORAL_TABLET | Freq: Three times a day (TID) | ORAL | 0 refills | Status: DC | PRN

## 2023-06-03 NOTE — Progress Notes (Signed)
Virtual Visit via Telephone Note  I connected with Jill Alexanders on 06/03/23 at  8:30 AM EDT by telephone and verified that I am speaking with the correct person using two identifiers.  Location: Patient: Home Provider: Clinic   I discussed the limitations, risks, security and privacy concerns of performing an evaluation and management service by telephone and the availability of in person appointments. I also discussed with the patient that there may be a patient responsible charge related to this service. The patient expressed understanding and agreed to proceed.   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  Patient was seen by [facility] oncology with plan for initiation of chemotherapy/radiation.  Unfortunately, patient has declined and family is now requesting hospice.   Observations/Objective: I spoke with patient's son and daughter-in-law by phone.  Reportedly, patient is actively declining with progressive weakness.  She is now mostly in the bed.  Having periods of agitation, intermittent confusion.  Family also reports labored breathing.  Family not interested in hospitalization.  Instead, they would like to keep patient home and focus on her comfort until end-of-life.  They request hospice involvement.  Hospice referral sent and patient will be seen later today for evaluation.  Given symptoms of labored breathing and intermittent agitation, will send in prescriptions for morphine and lorazepam.  Assessment and Plan: Primary CNS lymphoma -actively declining.  Hospice referral sent.  Family goal is to keep patient at home and comfortable until end-of-life.  Rx sent for morphine and lorazepam for symptom management. Case and plan discussed with Dr. Donneta Romberg.   Follow Up Instructions: As needed   I discussed the assessment and treatment plan with the patient. The patient was provided an opportunity to  ask questions and all were answered. The patient agreed with the plan and demonstrated an understanding of the instructions.   The patient was advised to call back or seek an in-person evaluation if the symptoms worsen or if the condition fails to improve as anticipated.  I provided 30 minutes of non-face-to-face time during this encounter.   Malachy Moan, NP

## 2023-06-03 NOTE — Telephone Encounter (Signed)
Informed him that Dr. Leonard Schwartz is recommending appointment with [facility] after Dr. Kipp Laurence appoiment 9/19. Son states that is not favorable and patient is not going to make it tomorrow. Patient is "rapidly declining, exhausted when going to restroom, extreme SOB with exertion, refusing to eat and disorientated". He is requesting a urgent referral for home hospice (visit today) and a hospital bed. Team on board

## 2023-06-03 NOTE — Telephone Encounter (Signed)
I called [facility] to cnl hospice referral. Daughter specifically requested [facility]. Initially, [facility] was contacted but unable to service patient this morning. Given the expedited need for hospice, Josh, NP reach out to [facility] to see the availability. [facility] contacted our office and can service pt at 2pm today. Family accepted this referral per Sharia Reeve, NP

## 2023-06-03 NOTE — Progress Notes (Signed)
Spoke to the son, with regards to hospice evaluation.  Agree with hospice transition given declining performance status.  All appointments at the cancer center canceled.  Family thankful for the call.

## 2023-06-03 NOTE — Telephone Encounter (Signed)
See note

## 2023-06-04 ENCOUNTER — Ambulatory Visit: Payer: No Typology Code available for payment source | Admitting: Radiation Oncology

## 2023-06-11 ENCOUNTER — Ambulatory Visit: Payer: No Typology Code available for payment source | Admitting: Radiation Oncology

## 2023-06-16 DEATH — deceased

## 2023-06-19 ENCOUNTER — Ambulatory Visit: Payer: No Typology Code available for payment source | Admitting: Internal Medicine

## 2023-06-19 ENCOUNTER — Other Ambulatory Visit: Payer: No Typology Code available for payment source

## 2023-11-12 IMAGING — MG MM DIGITAL SCREENING BILAT W/ TOMO AND CAD
8 series · 8 of 24 positions shown · non-contrast
Comparison: Previous exam(s).

CLINICAL DATA: Screening.

EXAM:
DIGITAL SCREENING BILATERAL MAMMOGRAM WITH TOMOSYNTHESIS AND CAD
TECHNIQUE: Bilateral screening digital craniocaudal and mediolateral oblique
mammograms were obtained. Bilateral screening digital breast
tomosynthesis was performed. The images were evaluated with
computer-aided detection.

[L MLO synth-2D]
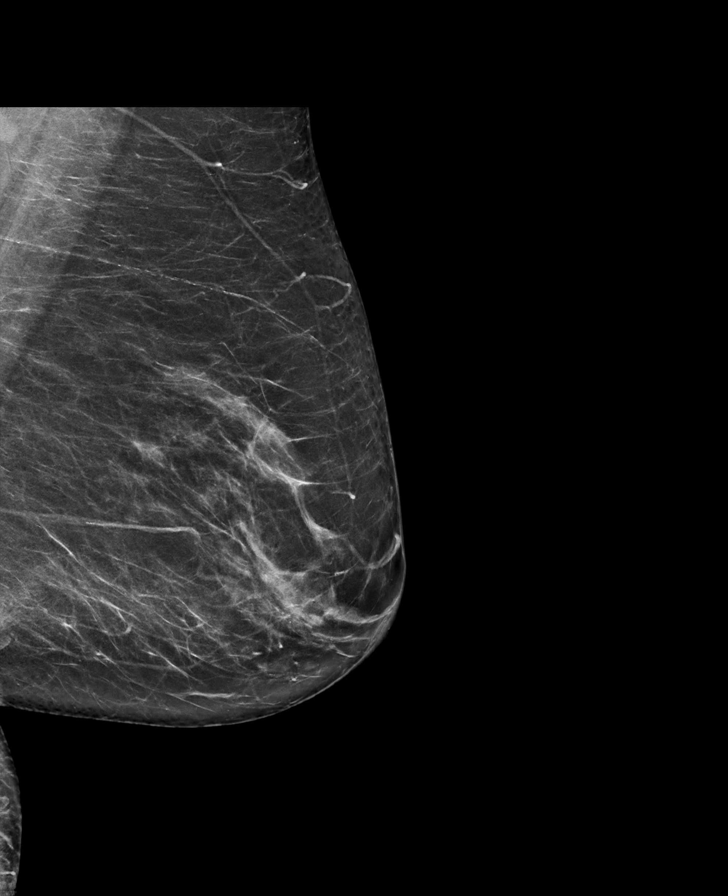

[L CC synth-2D]
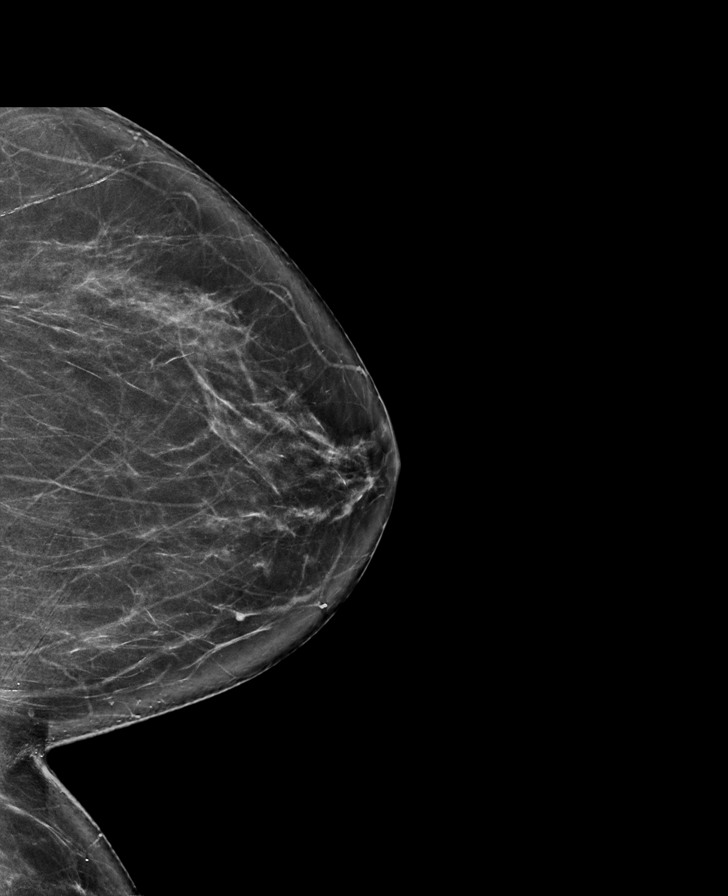

[R CC synth-2D]
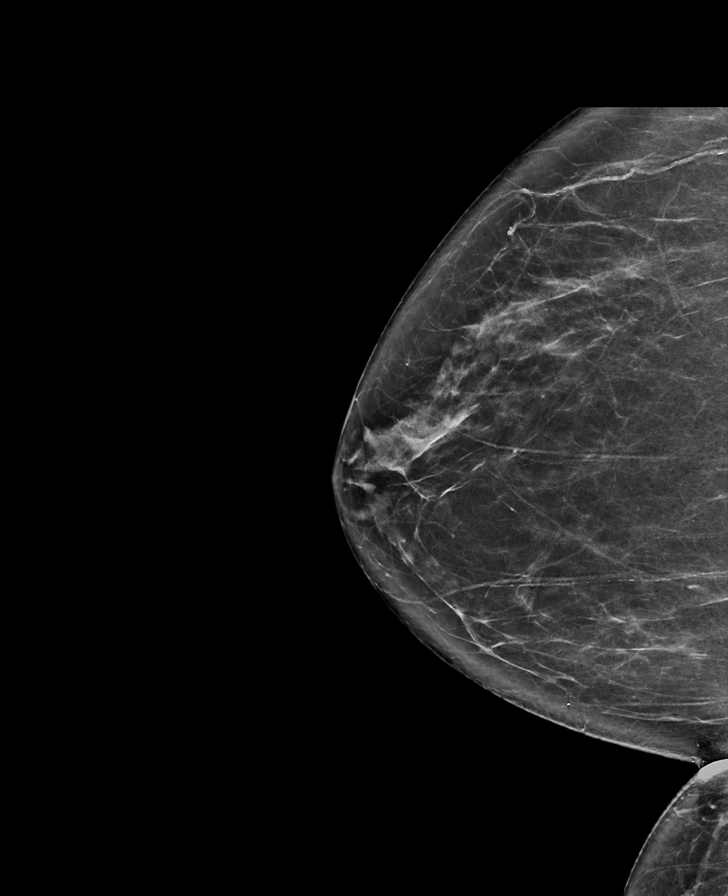

[R MLO synth-2D]
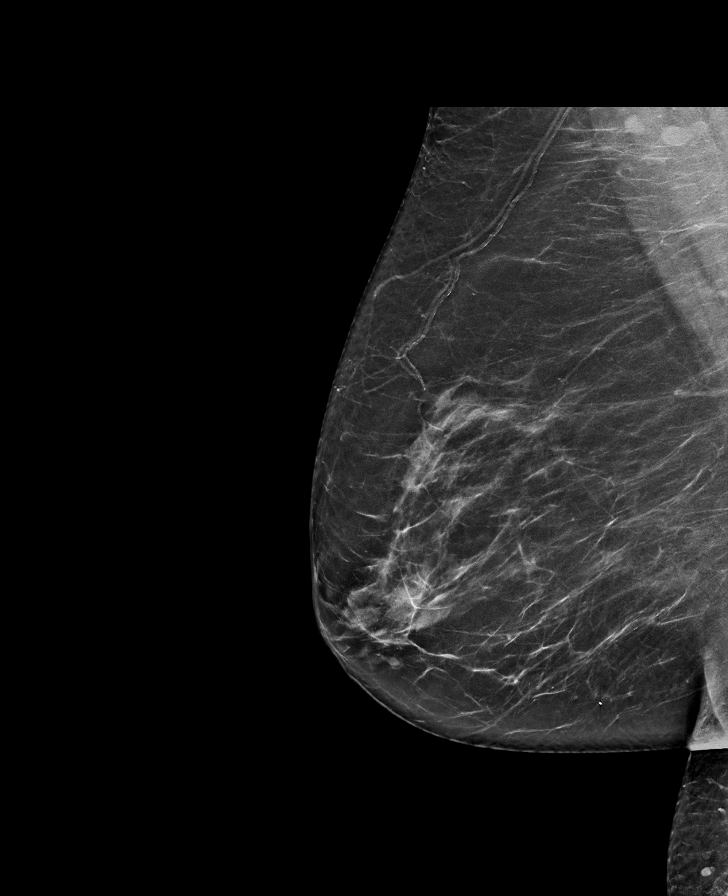

[R MLO tomo · tomo slice 40/79.0]
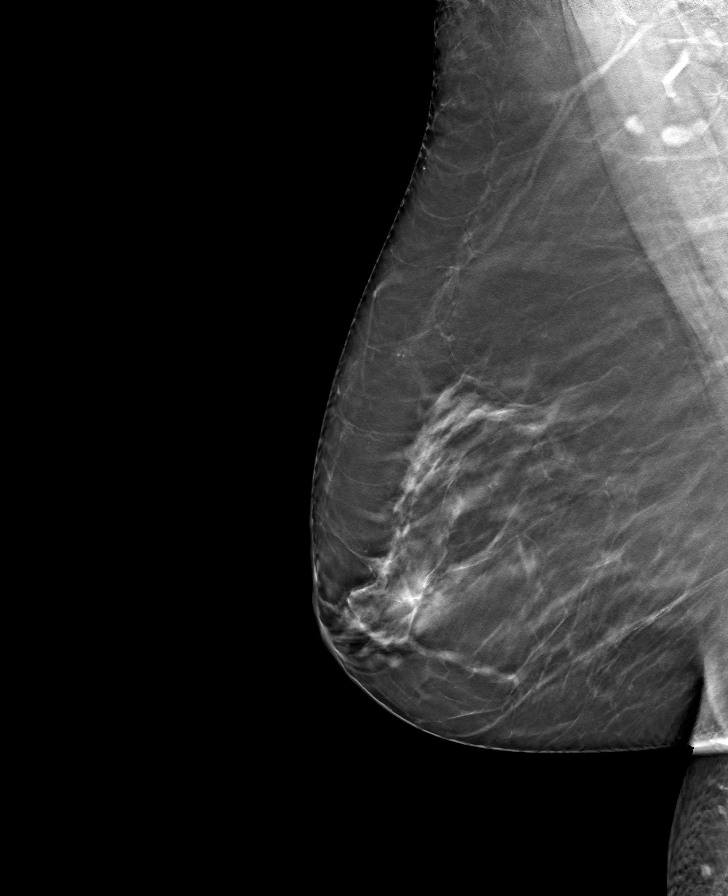

[L MLO tomo · tomo slice 39/76.0]
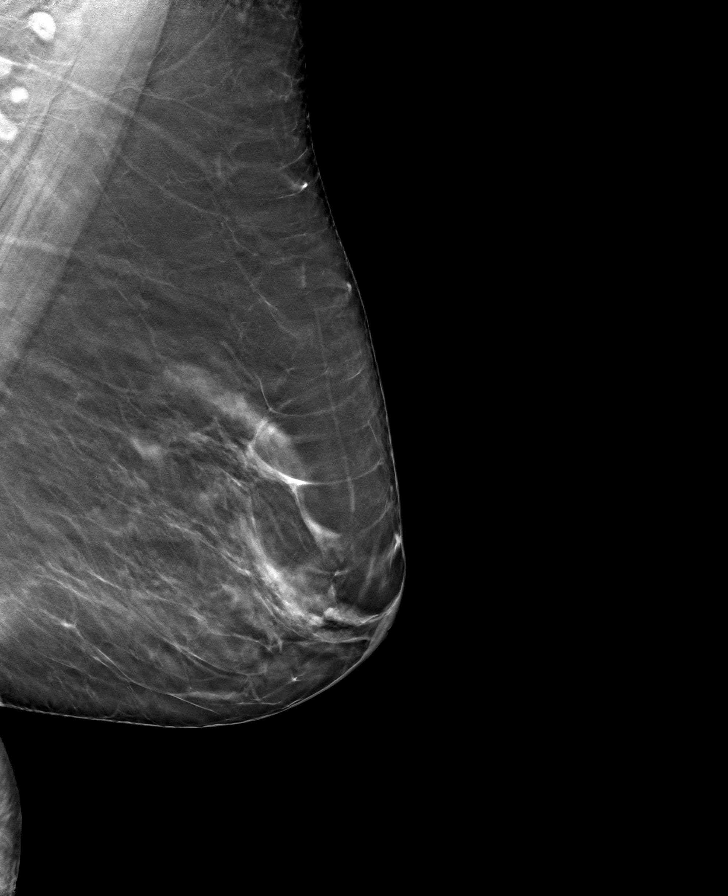

[L CC tomo · tomo slice 37/73.0]
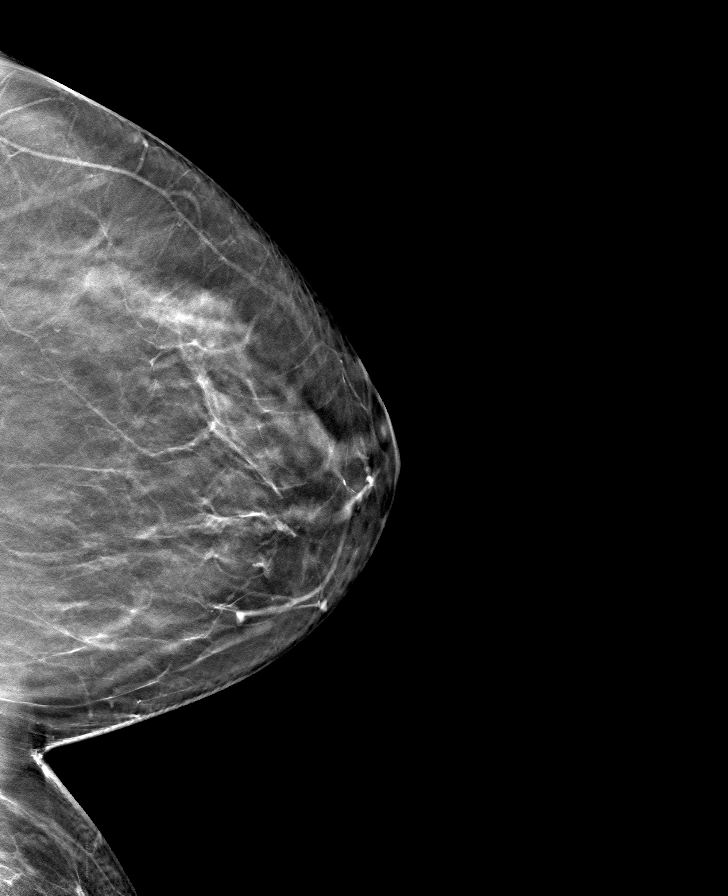

[R CC tomo · tomo slice 36/71.0]
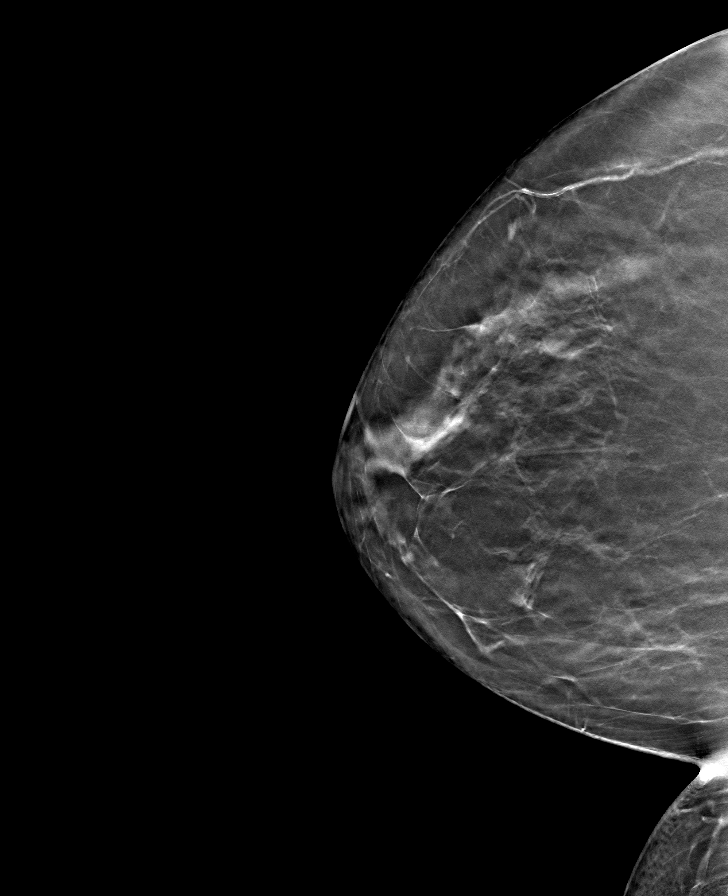

[8 of 24 positions shown; findings below may reference images not displayed]

ACR Breast Density Category b: There are scattered areas of
fibroglandular density.
FINDINGS: There are no findings suspicious for malignancy.
IMPRESSION: No mammographic evidence of malignancy. A result letter of this
screening mammogram will be mailed directly to the patient.

RECOMMENDATION:
Screening mammogram in one year. (Code:51-O-LD2)

BI-RADS CATEGORY  1: Negative.
